# Patient Record
Sex: Male | Born: 1990 | Race: White | Hispanic: No | Marital: Single | Smoking: Never smoker
Health system: Southern US, Community
[De-identification: ages and names within clinical notes are randomized; demographics above are authoritative.]

## PROBLEM LIST (undated history)

## (undated) DIAGNOSIS — Z789 Other specified health status: Secondary | ICD-10-CM

## (undated) DIAGNOSIS — S83519A Sprain of anterior cruciate ligament of unspecified knee, initial encounter: Secondary | ICD-10-CM

## (undated) DIAGNOSIS — S83206A Unspecified tear of unspecified meniscus, current injury, right knee, initial encounter: Secondary | ICD-10-CM

---
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
## 2011-08-28 ENCOUNTER — Encounter (HOSPITAL_COMMUNITY): Payer: Self-pay

## 2011-08-28 ENCOUNTER — Emergency Department (HOSPITAL_COMMUNITY)
Admission: EM | Admit: 2011-08-28 | Discharge: 2011-08-28 | Disposition: A | Discharge status: Home / Self Care | Payer: Self-pay | Source: Home / Self Care | Attending: Emergency Medicine | Admitting: Emergency Medicine

## 2011-08-28 DIAGNOSIS — N453 Epididymo-orchitis: Secondary | ICD-10-CM

## 2011-08-28 DIAGNOSIS — N451 Epididymitis: Secondary | ICD-10-CM

## 2011-08-28 LAB — POCT URINALYSIS DIP (DEVICE)
Bilirubin Urine: NEGATIVE
Glucose, UA: NEGATIVE mg/dL
Hgb urine dipstick: NEGATIVE
Nitrite: NEGATIVE
Specific Gravity, Urine: 1.025 (ref 1.005–1.030)

## 2011-08-28 MED ORDER — DICLOFENAC SODIUM 75 MG PO TBEC: 75.0000 mg | DELAYED_RELEASE_TABLET | Freq: Two times a day (BID) | ORAL | Status: DC

## 2011-08-28 MED ORDER — LIDOCAINE HCL (PF) 1 % IJ SOLN
INTRAMUSCULAR | Status: AC
  Filled 2011-08-28: qty 5

## 2011-08-28 MED ORDER — CEFTRIAXONE SODIUM 250 MG IJ SOLR
250.0000 mg | Freq: Once | INTRAMUSCULAR | Status: AC
  Administered 2011-08-28: 250 mg via INTRAMUSCULAR

## 2011-08-28 MED ORDER — CEFTRIAXONE SODIUM 250 MG IJ SOLR
INTRAMUSCULAR | Status: AC
  Filled 2011-08-28: qty 250

## 2011-08-28 MED ORDER — TRAMADOL HCL 50 MG PO TABS: 100.0000 mg | ORAL_TABLET | Freq: Three times a day (TID) | ORAL | Status: AC | PRN

## 2011-08-28 MED ORDER — AZITHROMYCIN 250 MG PO TABS
1000.0000 mg | ORAL_TABLET | Freq: Once | ORAL | Status: AC
  Administered 2011-08-28: 1000 mg via ORAL

## 2011-08-28 MED ORDER — AZITHROMYCIN 250 MG PO TABS
ORAL_TABLET | ORAL | Status: AC
  Filled 2011-08-28: qty 4

## 2011-08-28 NOTE — ED Provider Notes (Signed)
Chief Complaint  Patient presents with  . Testicle Pain    History of Present Illness:   [name] is a 21 year old male who has had a one-day history of left testicular swelling and sensitivity. He denies any dysuria or urethral discharge. He had intercourse last night and has a small bruise on the right side of the shaft of the penis. This is nontender. He denies fever, chills, sweats, nausea, or vomiting. He's had no bowel pain. No lymphadenopathy, skin rash, or genital ulcerations.  Review of Systems:  Other than noted above, the patient denies any of the following symptoms: General:  No fevers, chills, sweats, aches, or fatigue. GI:  No abdominal pain, back pain, nausea, vomiting, diarrhea, or constipation. GU:  No dysuria, frequency, urgency, hematuria, urethral discharge, penile lesions, penile pain, testicular pain, swelling, or mass, inguinal lymphadenopathy or incontinence.  PMFSH:  Past medical history, family history, social history, meds, and allergies were reviewed.  Physical Exam:   Vital signs:  BP 122/71  Pulse 68  Temp(Src) 98.9 F (37.2 C) (Oral)  Resp 18  SpO2 100% Gen:  Alert, oriented, in no distress. Lungs:  Clear to auscultation, no wheezes, rales or rhonchi. Heart:  Regular rhythm, no gallop or murmer. Abdomen:  Flat and soft.  No tenderness to palpation, guarding, or rebound.  No hepato-splenomegaly or mass.  Bowel sounds were normally active.  No hernia. Genital exam:  He has a small bruise on the right side of the shaft of the penis which was nontender. The right testicle was normal. The left testicle showed a mass underneath the testicle which was mildly tender to palpation. There is no urethral discharge and no ulcerations on the penis. Back:  No CVA tenderness.  Skin:  Clear, warm and dry.  Course in Urgent Care Center:   He was given Rocephin 250 mg IM and azithromycin 1000 mg by mouth.   Medical Decision Making:  This appears to be epididymitis, although  it could be a hydrocele or a testicular tumor. I told the patient was very important that he followup with urologist next week and gave him the name of Dr. Margarita Grizzle. A GC Chlamydia DNA probe was obtained as well as a routine urine and a culture.  Assessment:  Diagnoses that have been ruled out:  None  Diagnoses that are still under consideration:  None  Final diagnoses:  Epididymitis     Plan:   1.  The following meds were prescribed:   New Prescriptions   DICLOFENAC (VOLTAREN) 75 MG EC TABLET    Take 1 tablet (75 mg total) by mouth 2 (two) times daily.   TRAMADOL (ULTRAM) 50 MG TABLET    Take 2 tablets (100 mg total) by mouth every 8 (eight) hours as needed for pain.   2.  The patient was instructed in symptomatic care and handouts were given. 3.  The patient was told to return if becoming worse in any way, if no better in 3 or 4 days, and given some red flag symptoms that would indicate earlier return.  Follow up:  The patient was told to follow up with Dr. Margarita Grizzle next week.      Reuben Likes, MD 08/28/11 2055

## 2011-08-28 NOTE — ED Notes (Signed)
States he had sex last pm, and this AM, noted to have purple area on penile shaft and testicular swelling w pain; denies STD concerns prior to sex last PM

## 2011-08-28 NOTE — Discharge Instructions (Signed)
Epididymitis Epididymitis is a swelling (inflammation) of the epididymis. The epididymis is a cord-like structure along the back part of the testicle. Epididymitis is usually, but not always, caused by infection. This is usually a sudden problem beginning with chills, fever and pain behind the scrotum and in the testicle. There may be swelling and redness of the testicle. DIAGNOSIS  Physical examination will reveal a tender, swollen epididymis. Sometimes, cultures are obtained from the urine or from prostate secretions to help find out if there is an infection or if the cause is a different problem. Sometimes, blood work is performed to see if your white blood cell count is elevated and if a germ (bacterial) or viral infection is present. Using this knowledge, an appropriate medicine which kills germs (antibiotic) can be chosen by your caregiver. A viral infection causing epididymitis will most often go away (resolve) without treatment. HOME CARE INSTRUCTIONS   Hot sitz baths for 20 minutes, 4 times per day, may help relieve pain.   Only take over-the-counter or prescription medicines for pain, discomfort or fever as directed by your caregiver.   Take all medicines, including antibiotics, as directed. Take the antibiotics for the full prescribed length of time even if you are feeling better.   It is very important to keep all follow-up appointments.  SEEK IMMEDIATE MEDICAL CARE IF:   You have a fever.   You have pain not relieved with medicines.   You have any worsening of your problems.   Your pain seems to come and go.   You develop pain, redness, and swelling in the scrotum and surrounding areas.  MAKE SURE YOU:   Understand these instructions.   Will watch your condition.   Will get help right away if you are not doing well or get worse.  Document Released: 05/15/2000 Document Revised: 05/07/2011 Document Reviewed: 04/04/2009 [facility] Patient Information 2012 [place],  [place].Testicular Masses Most testicular masses, such as a growth or a swelling, are benign. This means they are not cancerous. Common types of testicular masses include:   Hydrocele is the most common benign testicular mass in an adult. Hydroceles are generally soft, painless scrotal swellings that are collections of fluid in the scrotal sac. These can rapidly change size as the fluid enters or leaves.   Spermatoceles are generally soft, painless, benign swellings that are cyst-like masses in the scrotum containing fluid. They can rapidly change size as the fluid enters or leaves. They are more prominent while standing or exercising. Sometimes, spermatoceles may cause a sensation of heaviness or a dull ache.   Varicocele is an enlargement of the veins that drain the testicles. This condition can increase the risk of infertility. They are more prominent while standing or exercising. Sometimes, varicoceles may cause a sensation of heaviness or a dull ache.   Inguinal hernia is a bulge caused by a portion of intestine protruding into the scrotum through a weak area in the abdominal muscles. Hernias may or may not be painful. They are soft and usually enlarge with coughing or straining.   Torsion of the testis can cause a testicular mass that develops quickly and is associated with tenderness and/or fever. This is caused by a twisting of the testicle within the sac. It also reduces the blood supply and can destroy the testis if not treated quickly with surgery.   Epididymitis is inflammation of the epididymis (a structure attached to the testicle), usually caused by a sexually transmitted infection or a urinary tract infection. This generally  shows up as testicular discomfort and swelling, and may include pain during urination.   Testicular appendages are remnants of tissue on the testis present since birth. A testicular appendage can twist on its blood supply and cause pain. In most cases, this is seen as  a blue dot on the scrotum.  A cancerous growth in the scrotum may first appear as a swelling. There may or may not be pain. The growth usually feels firm and shows up as a growth on the testicle. Any solid, firm growth in a testicle is considered cancer until proven otherwise. Cancer of the testicle most commonly affects men 94 to 20 years old. Risk factors include prior testicular tumor and cryptorchidism (undescended testis). Occasionally, testicular cancer may appear with symptoms (problems) of metastasis. This means the tumor (abnormal growth) has spread and is causing other problems that may include cough, shortness of breath or weight loss. Monthly testicular self-exams are recommended for all men. Get in the habit of examining your own testicles. A good time is while taking a shower. Get to know what your testicles feel like so you will know if there is a new growth or change in them. DIAGNOSIS  See your caregiver if you feel a growth in your testicle. Sometimes, all that is needed to make the diagnosis (determine what is wrong) is a physical exam. Your caregiver may shine a bright light through the scrotum to help make the diagnosis. This is called transillumination. The light will shine easily through a collection of fluid but will usually not shine through a tumor. Other testing, including blood tests and an ultrasound exam, may be done. An ultrasound exam bounces harmless sound waves off the testicles and produces a black and white picture almost like that produced by a camera. Diagnosis of testicular cancer can be made by measuring several substances in the blood, called markers), that may indicate the presence of certain cancers. TREATMENT  What is wrong determines how it is treated. Small hydroceles and spermatoceles often require no treatment. In some cases, however, they may be treated surgically. Hernias are repaired with surgery. Because epididymitis is usually caused by an infection, it is  usually treated with antibiotics. Varicoceles may be treated by surgery to tie off the affected veins. Testicular cancer treatment depends upon the type of cancer. Sometimes, some tissue is removed surgically as a way of trying to preserve the testicle but if a tumor is suspected, the preferred treatment is removal of the entire testicle. Further treatment may include watching the growth with strict follow-up, chemotherapy or radiation. If a growth has been found in a testicle, your caregiver will help you determine the best treatment. Document Released: 11/22/2002 Document Revised: 05/07/2011 Document Reviewed: 05/18/2005 [facility] Patient Information 2012 [place], [place].

## 2011-08-30 LAB — URINE CULTURE: Colony Count: NO GROWTH

## 2011-08-31 ENCOUNTER — Telehealth (HOSPITAL_COMMUNITY): Payer: Self-pay | Admitting: *Deleted

## 2011-08-31 LAB — GC/CHLAMYDIA PROBE AMP, GENITAL
Chlamydia, DNA Probe: UNDETERMINED
GC Probe Amp, Genital: NEGATIVE

## 2011-08-31 NOTE — ED Notes (Signed)
GC neg., Chlamydia Indeterminate, Urine culture: No growth. Pt. adequately treated with Zithromax and Rocephin.  No DHHS form required for Indeterminate results.  I called pt. but recording said subscriber was not available. Not able to leave a message. Vassie Moselle 08/31/2011

## 2011-09-01 ENCOUNTER — Encounter (HOSPITAL_COMMUNITY): Payer: Self-pay | Admitting: Emergency Medicine

## 2011-09-01 ENCOUNTER — Telehealth (HOSPITAL_COMMUNITY): Payer: Self-pay | Admitting: *Deleted

## 2011-09-01 ENCOUNTER — Emergency Department (INDEPENDENT_AMBULATORY_CARE_PROVIDER_SITE_OTHER)
Admission: EM | Admit: 2011-09-01 | Discharge: 2011-09-01 | Disposition: A | Discharge status: Home / Self Care | Payer: Self-pay | Source: Home / Self Care | Attending: Family Medicine | Admitting: Family Medicine

## 2011-09-01 DIAGNOSIS — J302 Other seasonal allergic rhinitis: Secondary | ICD-10-CM

## 2011-09-01 DIAGNOSIS — J309 Allergic rhinitis, unspecified: Secondary | ICD-10-CM

## 2011-09-01 DIAGNOSIS — T50905A Adverse effect of unspecified drugs, medicaments and biological substances, initial encounter: Secondary | ICD-10-CM

## 2011-09-01 DIAGNOSIS — T887XXA Unspecified adverse effect of drug or medicament, initial encounter: Secondary | ICD-10-CM

## 2011-09-01 MED ORDER — FLUTICASONE PROPIONATE 50 MCG/ACT NA SUSP: 2.0000 | Freq: Every day | NASAL | Status: DC

## 2011-09-01 MED ORDER — HYDROCOD POLST-CHLORPHEN POLST 10-8 MG/5ML PO LQCR: 5.0000 mL | Freq: Two times a day (BID) | ORAL | Status: DC

## 2011-09-01 MED ORDER — CETIRIZINE HCL 10 MG PO TABS: 10.0000 mg | ORAL_TABLET | Freq: Every day | ORAL | Status: DC

## 2011-09-01 NOTE — Discharge Instructions (Signed)
Stop other medicines if not needed for pain, see dr Margarita Grizzle as planned.

## 2011-09-01 NOTE — ED Provider Notes (Signed)
History     CSN: [id number]  Arrival date & time 09/01/11  1532   First MD Initiated Contact with Patient 09/01/11 1605      Chief Complaint  Patient presents with  . Abdominal Pain    (Consider location/radiation/quality/duration/timing/severity/associated sxs/prior treatment) Patient is a 21 y.o. male presenting with abdominal pain. The history is provided by the patient.  Abdominal Pain The primary symptoms of the illness include abdominal pain, fever, nausea and vomiting. The primary symptoms of the illness do not include diarrhea. The current episode started more than 2 days ago (seen 3/29 for epididimytis, given meds, now with cough, congestion, rhinorrhea.). The onset of the illness was gradual.    History reviewed. No pertinent past medical history.  History reviewed. No pertinent past surgical history.  No family history on file.  History  Substance Use Topics  . Smoking status: Never Smoker   . Smokeless tobacco: Not on file  . Alcohol Use: No      Review of Systems  Constitutional: Positive for fever.  HENT: Positive for congestion, rhinorrhea, sneezing and postnasal drip.   Respiratory: Positive for cough.   Cardiovascular: Positive for chest pain.  Gastrointestinal: Positive for nausea, vomiting and abdominal pain. Negative for diarrhea.    Allergies  Review of patient's allergies indicates no known allergies.  Home Medications   Current Outpatient Rx  Name Route Sig Dispense Refill  . DICLOFENAC SODIUM 75 MG PO TBEC Oral Take 1 tablet (75 mg total) by mouth 2 (two) times daily. 20 tablet 0  . TRAMADOL HCL 50 MG PO TABS Oral Take 2 tablets (100 mg total) by mouth every 8 (eight) hours as needed for pain. 30 tablet 0  . CETIRIZINE HCL 10 MG PO TABS Oral Take 1 tablet (10 mg total) by mouth daily. One tab daily for allergies 30 tablet 1  . HYDROCOD POLST-CPM POLST ER 10-8 MG/5ML PO LQCR Oral Take 5 mLs by mouth every 12 (twelve) hours. 115 mL 0  .  FLUTICASONE PROPIONATE 50 MCG/ACT NA SUSP Nasal Place 2 sprays into the nose daily. 1 g 2    BP 114/64  Pulse 84  Temp(Src) 97.1 F (36.2 C) (Oral)  Resp 16  SpO2 99%  Physical Exam  Nursing note and vitals reviewed. Constitutional: He is oriented to person, place, and time. He appears well-developed and well-nourished.  HENT:  Head: Normocephalic.  Right Ear: External ear normal.  Left Ear: External ear normal.  Nose: Mucosal edema and rhinorrhea present.  Mouth/Throat: Oropharynx is clear and moist.  Eyes: Conjunctivae are normal. Pupils are equal, round, and reactive to light.  Neck: Normal range of motion. Neck supple.  Cardiovascular: Normal rate, regular rhythm, normal heart sounds and intact distal pulses.   Pulmonary/Chest: Effort normal and breath sounds normal.  Lymphadenopathy:    He has no cervical adenopathy.  Neurological: He is alert and oriented to person, place, and time.  Skin: Skin is warm and dry.    ED Course  Procedures (including critical care time)  Labs Reviewed - No data to display No results found.   1. Seasonal allergic rhinitis   2. Adverse effects of medication       MDM          Linna Hoff, MD 09/01/11 [phone]

## 2011-09-01 NOTE — ED Notes (Signed)
Pt. in room 1 for another complaint. Pt. verified and given his results from 3/29. Pt. told his result for Chlamydia was indeterminate but that means it was initially positive but could not be verified. Pt. told he was adequately treated.  Pt. instructed to notify his partner, no sex for 1 week and to practice safe sex. Pt. told they can get HIV testing at the [facility] STD clinic.  Pt. voiced understanding.  Felix Pacini RN notified I had talked to pt. about his results. Vassie Moselle 09/01/2011

## 2011-09-01 NOTE — ED Notes (Signed)
C/o abdominal pain and fever, onset Friday. Reports one episode of vomiting today.  Reports nausea today.  Denies diarrhea.  Denies family members being sick. Reports temp of 101.5 over the weekend.

## 2011-12-16 ENCOUNTER — Encounter (HOSPITAL_COMMUNITY): Payer: Self-pay | Admitting: *Deleted

## 2011-12-16 ENCOUNTER — Emergency Department (HOSPITAL_COMMUNITY)
Admission: EM | Admit: 2011-12-16 | Discharge: 2011-12-16 | Disposition: A | Discharge status: Home / Self Care | Payer: Self-pay | Attending: Emergency Medicine | Admitting: Emergency Medicine

## 2011-12-16 DIAGNOSIS — Z23 Encounter for immunization: Secondary | ICD-10-CM | POA: Insufficient documentation

## 2011-12-16 DIAGNOSIS — W219XXA Striking against or struck by unspecified sports equipment, initial encounter: Secondary | ICD-10-CM | POA: Insufficient documentation

## 2011-12-16 DIAGNOSIS — S0180XA Unspecified open wound of other part of head, initial encounter: Secondary | ICD-10-CM | POA: Insufficient documentation

## 2011-12-16 DIAGNOSIS — Y9361 Activity, american tackle football: Secondary | ICD-10-CM | POA: Insufficient documentation

## 2011-12-16 DIAGNOSIS — S0191XA Laceration without foreign body of unspecified part of head, initial encounter: Secondary | ICD-10-CM

## 2011-12-16 MED ORDER — TETANUS-DIPHTH-ACELL PERTUSSIS 5-2.5-18.5 LF-MCG/0.5 IM SUSP
0.5000 mL | Freq: Once | INTRAMUSCULAR | Status: AC
  Administered 2011-12-16: 0.5 mL via INTRAMUSCULAR
  Filled 2011-12-16: qty 0.5

## 2011-12-16 NOTE — ED Notes (Signed)
NP at bedside for suturing

## 2011-12-16 NOTE — ED Notes (Signed)
Apologized for delays.  Lac cleaned and dressed by this RN.

## 2011-12-16 NOTE — ED Notes (Signed)
NP continues at bedside for lac repair.

## 2011-12-16 NOTE — ED Notes (Addendum)
Patient was playing football when he was elbowed on his right forehead.  Denies any LOC.  Laceration is on his right eyebrow and an inch.  No bleeding at this time

## 2011-12-17 NOTE — ED Provider Notes (Signed)
History     CSN: [id number]  Arrival date & time 12/16/11  2009   First MD Initiated Contact with Patient 12/16/11 2213      Chief Complaint  Patient presents with  . Head Laceration    (Consider location/radiation/quality/duration/timing/severity/associated sxs/prior treatment) HPI Comments: [name] 21 y.o. male   The chief complaint is: Patient presents with:   Head Laceration   The patient has medical history significant for:   History reviewed. No pertinent past medical history.  Patient present with laceration s/p elbow to the forehead while playing football at 7:45pm on 12/16/11. Patient states there was a significant amount of bleeding after the blow, but he did not lose consciousness. Denies HA, vision changes. Denies NV. Denies SOB. Patient unsure of last tetanus.     Patient is a 21 y.o. male presenting with scalp laceration. The history is provided by the patient.  Head Laceration Pertinent negatives include no abdominal pain, headaches, nausea, neck pain or vomiting.    History reviewed. No pertinent past medical history.  History reviewed. No pertinent past surgical history.  History reviewed. No pertinent family history.  History  Substance Use Topics  . Smoking status: Never Smoker   . Smokeless tobacco: Not on file  . Alcohol Use: No      Review of Systems  Constitutional: Negative for activity change.  HENT: Negative for neck pain.   Eyes: Negative for visual disturbance.  Respiratory: Negative for shortness of breath.   Gastrointestinal: Negative for nausea, vomiting and abdominal pain.  Skin: Positive for wound.  Neurological: Negative for dizziness, syncope and headaches.    Allergies  Review of patient's allergies indicates no known allergies.  Home Medications  No current outpatient prescriptions on file.  BP 140/74  Pulse 71  Temp 97.9 F (36.6 C) (Oral)  Resp 20  SpO2 98%  Physical Exam  Nursing note and vitals  reviewed. Constitutional: He appears well-developed and well-nourished.  HENT:  Head: Normocephalic. Head is with laceration.         2cm laceration involving the right eyebrow.   Neck: Normal range of motion.  Cardiovascular: Normal rate, regular rhythm and normal heart sounds.   Pulmonary/Chest: Effort normal and breath sounds normal.  Abdominal: Soft. Bowel sounds are normal. There is no tenderness.  Neurological: He is alert.  Skin: Skin is warm and dry.    ED Course  Procedures (including critical care time)  Labs Reviewed - No data to display No results found.   1. Laceration of head   LACERATION REPAIR Performed by: Pixie Casino Authorized by: Pixie Casino Consent: Verbal consent obtained. Risks and benefits: risks, benefits and alternatives were discussed Consent given by: patient Patient identity confirmed: provided demographic data Prepped and Draped in normal sterile fashion Wound explored  Laceration Location: right eyebrow extending into forehead  Laceration Length: 2cm  No Foreign Bodies seen or palpated  Anesthesia: local infiltration  Local anesthetic: lidocaine 2%  Anesthetic total: 3ml  Irrigation method: syringe Amount of cleaning: standard  Skin closure: well approximated   Number of sutures: 6  Technique: simple interrupted  Patient tolerance: Patient tolerated the procedure well with no immediate complications.     MDM  21 y/o male who presents with laceration s/p elbow to the forehead. 6 sutures 3.0 vicryl place successfully without complication. Patient discharged with instructions to return to ED, urgent care, or PCP in 5 days for removal. Tetanus given in ED. Patient given return precautions verbally and in  discharge instructions.        Pixie Casino, PA-C 12/17/11 0128

## 2011-12-18 NOTE — ED Provider Notes (Signed)
Medical screening examination/treatment/procedure(s) were performed by non-physician practitioner and as supervising physician I was immediately available for consultation/collaboration.   Raidyn Breiner, MD 12/18/11 1534 

## 2011-12-21 ENCOUNTER — Encounter (HOSPITAL_COMMUNITY): Payer: Self-pay | Admitting: Emergency Medicine

## 2011-12-21 ENCOUNTER — Emergency Department (HOSPITAL_COMMUNITY)
Admission: EM | Admit: 2011-12-21 | Discharge: 2011-12-21 | Disposition: A | Discharge status: Home / Self Care | Payer: Self-pay | Attending: Emergency Medicine | Admitting: Emergency Medicine

## 2011-12-21 DIAGNOSIS — Z4802 Encounter for removal of sutures: Secondary | ICD-10-CM | POA: Insufficient documentation

## 2011-12-21 NOTE — ED Notes (Signed)
Sutures  Need removing edges well approximated no s/s of infection

## 2011-12-21 NOTE — ED Provider Notes (Signed)
History   This chart was scribed for No att. providers found by Toya Smothers. The patient was seen in room TR08C/TR08C. Patient's care was started at 1313.  CSN: [id number]  Arrival date & time 12/21/11  1313   None     Chief Complaint  Patient presents with  . Suture / Staple Removal   Patient is a 21 y.o. male presenting with suture removal. The history is provided by the patient. No language interpreter was used.  Suture / Staple Removal    [name] is a 21 y.o. male who presents to the Emergency Department for staple removal. Pt sustained an elbow to the face while playing football causing the laceration. Sutures were placed a week ago, and everything appears to be healing fine. Pt has no complaints.  History reviewed. No pertinent past medical history.  History reviewed. No pertinent past surgical history.  History reviewed. No pertinent family history.  History  Substance Use Topics  . Smoking status: Never Smoker   . Smokeless tobacco: Not on file  . Alcohol Use: No   Review of Systems  Constitutional: Negative for fever and chills.  HENT: Negative for rhinorrhea and neck pain.   Eyes: Negative for pain.  Respiratory: Negative for cough and shortness of breath.   Cardiovascular: Negative for chest pain.  Gastrointestinal: Negative for nausea, vomiting, abdominal pain and diarrhea.  Genitourinary: Negative for dysuria.  Musculoskeletal: Negative for back pain.  Skin: Positive for wound. Negative for rash.  Neurological: Negative for dizziness and weakness.    Allergies  Review of patient's allergies indicates no known allergies.  Home Medications  No current outpatient prescriptions on file.  BP 132/76  Pulse 91  Temp 98.2 F (36.8 C) (Oral)  Resp 16  SpO2 98%  Physical Exam  Nursing note and vitals reviewed. Constitutional: He is oriented to person, place, and time. He appears well-developed and well-nourished. No distress.  HENT:  Head:  Normocephalic and atraumatic.       Laceration above the R eyebrow. Sutures have been removed and appear to be healing fine.  Neck: Normal range of motion. Neck supple. No tracheal deviation present.  Pulmonary/Chest: Effort normal. No respiratory distress.  Abdominal: He exhibits no distension.  Musculoskeletal: Normal range of motion. He exhibits no edema.  Neurological: He is alert and oriented to person, place, and time. No sensory deficit.  Skin: Skin is warm and dry.  Psychiatric: He has a normal mood and affect. His behavior is normal.    ED Course  Procedures (including critical care time) DIAGNOSTIC STUDIES: Oxygen Saturation is 98% on room air, normal by my interpretation.    COORDINATION OF CARE: 1511- Evaluated Pt. Advised pt to wait a week prior to fighting agf  Labs Reviewed - No data to display No results found.   No diagnosis found.    MDM  Sutures removed and wound looks good.  Will discharge to home.      I personally performed the services described in this documentation, which was scribed in my presence. The recorded information has been reviewed and considered.      Geoffery Lyons, MD 12/21/11 [phone]

## 2012-10-04 HISTORY — PX: KNEE ARTHROSCOPY WITH ANTERIOR CRUCIATE LIGAMENT (ACL) REPAIR: SHX5644

## 2013-05-15 ENCOUNTER — Encounter (HOSPITAL_BASED_OUTPATIENT_CLINIC_OR_DEPARTMENT_OTHER): Payer: Self-pay | Admitting: *Deleted

## 2013-05-15 ENCOUNTER — Other Ambulatory Visit: Payer: Self-pay | Admitting: Orthopedic Surgery

## 2013-05-15 NOTE — Progress Notes (Signed)
NPO AFTER MN WITH EXCEPTION WATER/ GATORADE UNTIL 0700. ARRIVE AT 1100. NEEDS HG.

## 2013-05-16 ENCOUNTER — Encounter (HOSPITAL_BASED_OUTPATIENT_CLINIC_OR_DEPARTMENT_OTHER): Payer: Self-pay

## 2013-05-16 ENCOUNTER — Encounter (HOSPITAL_BASED_OUTPATIENT_CLINIC_OR_DEPARTMENT_OTHER): Payer: BC Managed Care – PPO | Admitting: Anesthesiology

## 2013-05-16 ENCOUNTER — Ambulatory Visit (HOSPITAL_BASED_OUTPATIENT_CLINIC_OR_DEPARTMENT_OTHER)
Admission: RE | Admit: 2013-05-16 | Discharge: 2013-05-16 | Disposition: A | Discharge status: Home / Self Care | Payer: BC Managed Care – PPO | Source: Ambulatory Visit | Attending: Specialist | Admitting: Specialist

## 2013-05-16 ENCOUNTER — Ambulatory Visit (HOSPITAL_BASED_OUTPATIENT_CLINIC_OR_DEPARTMENT_OTHER): Payer: BC Managed Care – PPO | Admitting: Anesthesiology

## 2013-05-16 ENCOUNTER — Encounter (HOSPITAL_BASED_OUTPATIENT_CLINIC_OR_DEPARTMENT_OTHER)
Admission: RE | Disposition: A | Discharge status: Home / Self Care | Payer: Self-pay | Source: Ambulatory Visit | Attending: Specialist

## 2013-05-16 DIAGNOSIS — M238X9 Other internal derangements of unspecified knee: Secondary | ICD-10-CM | POA: Insufficient documentation

## 2013-05-16 DIAGNOSIS — IMO0002 Reserved for concepts with insufficient information to code with codable children: Secondary | ICD-10-CM | POA: Insufficient documentation

## 2013-05-16 DIAGNOSIS — X58XXXA Exposure to other specified factors, initial encounter: Secondary | ICD-10-CM | POA: Insufficient documentation

## 2013-05-16 DIAGNOSIS — Z9889 Other specified postprocedural states: Secondary | ICD-10-CM

## 2013-05-16 DIAGNOSIS — S83509A Sprain of unspecified cruciate ligament of unspecified knee, initial encounter: Secondary | ICD-10-CM | POA: Insufficient documentation

## 2013-05-16 HISTORY — DX: Unspecified tear of unspecified meniscus, current injury, right knee, initial encounter: S83.206A

## 2013-05-16 HISTORY — DX: Sprain of anterior cruciate ligament of unspecified knee, initial encounter: S83.519A

## 2013-05-16 HISTORY — PX: KNEE ARTHROSCOPY WITH ANTERIOR CRUCIATE LIGAMENT (ACL) REPAIR: SHX5644

## 2013-05-16 SURGERY — KNEE ARTHROSCOPY WITH ANTERIOR CRUCIATE LIGAMENT (ACL) REPAIR
Anesthesia: General | Site: Knee | Laterality: Right

## 2013-05-16 MED ORDER — LACTATED RINGERS IR SOLN
Status: DC | PRN
  Administered 2013-05-16: 27000 mL

## 2013-05-16 MED ORDER — FENTANYL CITRATE 0.05 MG/ML IJ SOLN
INTRAMUSCULAR | Status: AC
  Filled 2013-05-16: qty 4

## 2013-05-16 MED ORDER — LIDOCAINE HCL (CARDIAC) 20 MG/ML IV SOLN
INTRAVENOUS | Status: DC | PRN
  Administered 2013-05-16: 100 mg via INTRAVENOUS

## 2013-05-16 MED ORDER — SODIUM CHLORIDE 0.9 % IR SOLN
Status: DC | PRN
  Administered 2013-05-16: 500 mL

## 2013-05-16 MED ORDER — METHOCARBAMOL 500 MG PO TABS: 500.0000 mg | ORAL_TABLET | Freq: Four times a day (QID) | ORAL | Status: DC | PRN

## 2013-05-16 MED ORDER — MIDAZOLAM HCL 2 MG/2ML IJ SOLN
INTRAMUSCULAR | Status: AC
  Filled 2013-05-16: qty 2

## 2013-05-16 MED ORDER — LIDOCAINE-EPINEPHRINE 2 %-1:100000 IJ SOLN
INTRAMUSCULAR | Status: DC | PRN
  Administered 2013-05-16: 15 mL via PERINEURAL

## 2013-05-16 MED ORDER — CEPHALEXIN 500 MG PO CAPS: 500.0000 mg | ORAL_CAPSULE | Freq: Three times a day (TID) | ORAL | Status: DC

## 2013-05-16 MED ORDER — FENTANYL CITRATE 0.05 MG/ML IJ SOLN
100.0000 ug | Freq: Once | INTRAMUSCULAR | Status: AC
  Administered 2013-05-16: 100 ug via INTRAVENOUS
  Filled 2013-05-16: qty 2

## 2013-05-16 MED ORDER — MORPHINE SULFATE 4 MG/ML IJ SOLN
INTRAMUSCULAR | Status: AC
  Filled 2013-05-16: qty 1

## 2013-05-16 MED ORDER — FENTANYL CITRATE 0.05 MG/ML IJ SOLN
INTRAMUSCULAR | Status: DC | PRN
  Administered 2013-05-16 (×4): 25 ug via INTRAVENOUS

## 2013-05-16 MED ORDER — HYDROMORPHONE HCL PF 1 MG/ML IJ SOLN
0.2500 mg | INTRAMUSCULAR | Status: DC | PRN
  Filled 2013-05-16: qty 1

## 2013-05-16 MED ORDER — ONDANSETRON HCL 4 MG/2ML IJ SOLN
INTRAMUSCULAR | Status: DC | PRN
  Administered 2013-05-16: 4 mg via INTRAVENOUS

## 2013-05-16 MED ORDER — DEXAMETHASONE SODIUM PHOSPHATE 4 MG/ML IJ SOLN
INTRAMUSCULAR | Status: DC | PRN
  Administered 2013-05-16: 10 mg via INTRAVENOUS

## 2013-05-16 MED ORDER — PROPOFOL 10 MG/ML IV BOLUS
INTRAVENOUS | Status: DC | PRN
  Administered 2013-05-16: 200 mg via INTRAVENOUS

## 2013-05-16 MED ORDER — MORPHINE SULFATE 4 MG/ML IJ SOLN
INTRAMUSCULAR | Status: DC | PRN
  Administered 2013-05-16: 4 mg via INTRAVENOUS

## 2013-05-16 MED ORDER — ASPIRIN EC 325 MG PO TBEC: 325.0000 mg | DELAYED_RELEASE_TABLET | Freq: Two times a day (BID) | ORAL | Status: DC

## 2013-05-16 MED ORDER — LACTATED RINGERS IV SOLN
INTRAVENOUS | Status: DC
  Administered 2013-05-16 (×3): via INTRAVENOUS
  Filled 2013-05-16: qty 1000

## 2013-05-16 MED ORDER — KETOROLAC TROMETHAMINE 30 MG/ML IJ SOLN
15.0000 mg | Freq: Once | INTRAMUSCULAR | Status: DC | PRN
  Filled 2013-05-16: qty 1

## 2013-05-16 MED ORDER — SODIUM CHLORIDE 0.9 % IV SOLN
INTRAVENOUS | Status: DC
  Filled 2013-05-16: qty 1000

## 2013-05-16 MED ORDER — POVIDONE-IODINE 7.5 % EX SOLN
Freq: Once | CUTANEOUS | Status: DC
  Filled 2013-05-16: qty 118

## 2013-05-16 MED ORDER — LIDOCAINE HCL 1 % IJ SOLN
INTRAMUSCULAR | Status: AC
  Filled 2013-05-16: qty 20

## 2013-05-16 MED ORDER — METOCLOPRAMIDE HCL 5 MG/ML IJ SOLN
INTRAMUSCULAR | Status: DC | PRN
  Administered 2013-05-16: 10 mg via INTRAVENOUS

## 2013-05-16 MED ORDER — MIDAZOLAM HCL 5 MG/5ML IJ SOLN
INTRAMUSCULAR | Status: DC | PRN
  Administered 2013-05-16: 1 mg via INTRAVENOUS

## 2013-05-16 MED ORDER — CEFAZOLIN SODIUM-DEXTROSE 2-3 GM-% IV SOLR
2.0000 g | INTRAVENOUS | Status: AC
  Administered 2013-05-16: 2 g via INTRAVENOUS
  Filled 2013-05-16: qty 50

## 2013-05-16 MED ORDER — FENTANYL CITRATE 0.05 MG/ML IJ SOLN
INTRAMUSCULAR | Status: AC
  Filled 2013-05-16: qty 2

## 2013-05-16 MED ORDER — PROMETHAZINE HCL 25 MG/ML IJ SOLN
6.2500 mg | INTRAMUSCULAR | Status: DC | PRN
  Filled 2013-05-16: qty 1

## 2013-05-16 MED ORDER — ACETAMINOPHEN 10 MG/ML IV SOLN
INTRAVENOUS | Status: DC | PRN
  Administered 2013-05-16: 1000 mg via INTRAVENOUS

## 2013-05-16 MED ORDER — MIDAZOLAM HCL 2 MG/2ML IJ SOLN
2.0000 mg | Freq: Once | INTRAMUSCULAR | Status: AC
  Administered 2013-05-16: 2 mg via INTRAVENOUS
  Filled 2013-05-16: qty 2

## 2013-05-16 MED ORDER — OXYCODONE-ACETAMINOPHEN 5-325 MG PO TABS: 1.0000 | ORAL_TABLET | ORAL | Status: DC | PRN

## 2013-05-16 MED ORDER — BUPIVACAINE HCL (PF) 0.25 % IJ SOLN
INTRAMUSCULAR | Status: DC | PRN
  Administered 2013-05-16: 30 mL

## 2013-05-16 MED ORDER — ROPIVACAINE HCL 5 MG/ML IJ SOLN
INTRAMUSCULAR | Status: DC | PRN
  Administered 2013-05-16: 25 mL via PERINEURAL

## 2013-05-16 SURGICAL SUPPLY — 86 items
ANCHOR BUTTON TIGHTROPE BTB (Anchor) ×2 IMPLANT
ANCHOR PUSHLOCK BIOCOMP 3.5X19 (Orthopedic Implant) ×2 IMPLANT
BANDAGE ESMARK 6X9 LF (GAUZE/BANDAGES/DRESSINGS) ×1 IMPLANT
BANDAGE GAUZE ELAST BULKY 4 IN (GAUZE/BANDAGES/DRESSINGS) ×2 IMPLANT
BENZOIN TINCTURE PRP APPL 2/3 (GAUZE/BANDAGES/DRESSINGS) ×2 IMPLANT
BLADE 4.2CUDA (BLADE) IMPLANT
BLADE CUDA 5.5 (BLADE) ×2 IMPLANT
BLADE CUDA GRT WHITE 3.5 (BLADE) ×2 IMPLANT
BLADE SURG 10 STRL SS (BLADE) ×4 IMPLANT
BLADE SURG 15 STRL LF DISP TIS (BLADE) ×1 IMPLANT
BLADE SURG 15 STRL SS (BLADE) ×1
BNDG ESMARK 6X9 LF (GAUZE/BANDAGES/DRESSINGS) ×2
BNDG GAUZE ELAST 4 BULKY (GAUZE/BANDAGES/DRESSINGS) ×4 IMPLANT
BONE MATRIX DEMINERALIZED 1CC (Bone Implant) ×6 IMPLANT
BUR OVAL 6.0 (BURR) IMPLANT
BUR VERTEX HOODED 4.5 (BURR) IMPLANT
BUTTON EXT TIGHTROPE 5X20 (Orthopedic Implant) ×2 IMPLANT
CANISTER SUCT LVC 12 LTR MEDI- (MISCELLANEOUS) ×4 IMPLANT
CANISTER SUCTION 1200CC (MISCELLANEOUS) IMPLANT
CANISTER SUCTION 2500CC (MISCELLANEOUS) IMPLANT
CLOTH BEACON ORANGE TIMEOUT ST (SAFETY) ×2 IMPLANT
COVER TABLE BACK 60X90 (DRAPES) ×2 IMPLANT
CUTTER FLIP II 9.5MM (INSTRUMENTS) ×2 IMPLANT
CUTTER RETRO 9.5MM (CUTTER) ×2 IMPLANT
DRAPE ARTHROSCOPY W/POUCH 114 (DRAPES) ×2 IMPLANT
DRAPE INCISE IOBAN 66X45 STRL (DRAPES) ×2 IMPLANT
DRAPE LG THREE QUARTER DISP (DRAPES) ×4 IMPLANT
DRAPE OEC MINIVIEW 54X84 (DRAPES) ×2 IMPLANT
DRAPE U-SHAPE 47X51 STRL (DRAPES) ×2 IMPLANT
DRSG PAD ABDOMINAL 8X10 ST (GAUZE/BANDAGES/DRESSINGS) ×2 IMPLANT
DURAPREP 26ML APPLICATOR (WOUND CARE) ×2 IMPLANT
ELECT REM PT RETURN 9FT ADLT (ELECTROSURGICAL) ×2
ELECTRODE REM PT RTRN 9FT ADLT (ELECTROSURGICAL) ×1 IMPLANT
GAUZE XEROFORM 1X8 LF (GAUZE/BANDAGES/DRESSINGS) ×2 IMPLANT
GLOVE BIO SURGEON STRL SZ7.5 (GLOVE) ×2 IMPLANT
GLOVE INDICATOR 7.5 STRL GRN (GLOVE) ×4 IMPLANT
GLOVE INDICATOR 8.0 STRL GRN (GLOVE) ×4 IMPLANT
GLOVE SURG ORTHO 8.0 STRL STRW (GLOVE) ×2 IMPLANT
GOWN PREVENTION PLUS LG XLONG (DISPOSABLE) ×4 IMPLANT
GOWN STRL REIN XL XLG (GOWN DISPOSABLE) ×4 IMPLANT
GUIDE PIN REFRO (PIN) ×2 IMPLANT
IMMOBILIZER KNEE 22 UNIV (SOFTGOODS) ×2 IMPLANT
IV LACTATED RINGER IRRG 3000ML (IV SOLUTION) ×9
IV LR IRRIG 3000ML ARTHROMATIC (IV SOLUTION) ×9 IMPLANT
KIT BIOCARTILAGE DEL W/SYRINGE (KITS) ×2 IMPLANT
KIT IMPLANT TIGHTROPE ABS OPEN (Anchor) ×2 IMPLANT
KIT TRANSTIBIAL (DISPOSABLE) IMPLANT
KNEE WRAP E Z 3 GEL PACK (MISCELLANEOUS) ×2 IMPLANT
LOOP 2 FIBERLINK CLOSED (SUTURE) ×2 IMPLANT
MINI VAC (SURGICAL WAND) IMPLANT
MIXING AND DELIVERY SYSTEM ×2 IMPLANT
NEEDLE HYPO 22GX1.5 SAFETY (NEEDLE) ×2 IMPLANT
NS IRRIG 500ML POUR BTL (IV SOLUTION) ×2 IMPLANT
PACK ARTHROSCOPY DSU (CUSTOM PROCEDURE TRAY) ×2 IMPLANT
PACK BASIN DAY SURGERY FS (CUSTOM PROCEDURE TRAY) ×2 IMPLANT
PAD ABD 8X10 STRL (GAUZE/BANDAGES/DRESSINGS) ×4 IMPLANT
PADDING CAST ABS 4INX4YD NS (CAST SUPPLIES)
PADDING CAST ABS COTTON 4X4 ST (CAST SUPPLIES) IMPLANT
PENCIL BUTTON HOLSTER BLD 10FT (ELECTRODE) ×2 IMPLANT
RETRO CUTTER 9.5MM ×2 IMPLANT
SET ARTHROSCOPY TUBING (MISCELLANEOUS) ×1
SET ARTHROSCOPY TUBING LN (MISCELLANEOUS) ×1 IMPLANT
SET PAD KNEE POSITIONER (MISCELLANEOUS) ×2 IMPLANT
SPONGE GAUZE 4X4 12PLY (GAUZE/BANDAGES/DRESSINGS) ×2 IMPLANT
SPONGE LAP 4X18 X RAY DECT (DISPOSABLE) IMPLANT
STRIP CLOSURE SKIN 1/2X4 (GAUZE/BANDAGES/DRESSINGS) ×2 IMPLANT
SUCTION FRAZIER TIP 10 FR DISP (SUCTIONS) ×2 IMPLANT
SUT 2 FIBERLOOP 20 STRT BLUE (SUTURE) ×4
SUT ETHILON 4 0 PS 2 18 (SUTURE) ×2 IMPLANT
SUT FIBERWIRE #2 38 T-5 BLUE (SUTURE) ×2
SUT MNCRL AB 3-0 PS2 18 (SUTURE) ×2 IMPLANT
SUT VIC AB 0 CT1 36 (SUTURE) ×4 IMPLANT
SUT VIC AB 0 CT2 27 (SUTURE) IMPLANT
SUT VIC AB 2-0 CT1 27 (SUTURE) ×1
SUT VIC AB 2-0 CT1 TAPERPNT 27 (SUTURE) ×1 IMPLANT
SUTURE 2 FIBERLOOP 20 STRT BLU (SUTURE) ×2 IMPLANT
SUTURE FIBERWR #2 38 T-5 BLUE (SUTURE) ×1 IMPLANT
SUTURE TIGERSTICK 2 TIGERWIR 2 (MISCELLANEOUS) ×2 IMPLANT
SYR CONTROL 10ML LL (SYRINGE) ×2 IMPLANT
TIGERSTICK 2 TIGERWIRE 2 (MISCELLANEOUS) ×4
TISSUE GRAFTLINK FGL (Tissue) ×2 IMPLANT
TOWEL OR 17X24 6PK STRL BLUE (TOWEL DISPOSABLE) ×2 IMPLANT
TightROPE abs BUTTON, ROUND ×2 IMPLANT
WAND 30 DEG SABER W/CORD (SURGICAL WAND) IMPLANT
WAND 90 DEG TURBOVAC W/CORD (SURGICAL WAND) IMPLANT
WATER STERILE IRR 500ML POUR (IV SOLUTION) ×2 IMPLANT

## 2013-05-16 NOTE — H&P (View-Only) (Signed)
NPO AFTER MN WITH EXCEPTION WATER/ GATORADE UNTIL 0700. ARRIVE AT 1100. NEEDS HG. 

## 2013-05-16 NOTE — Transfer of Care (Signed)
Immediate Anesthesia Transfer of Care Note  Patient: [name]  Procedure(s) Performed: Procedure(s) (LRB): RIGHT KNEE ARTHROSCOPY DEBRIDEMENT PARTIAL MEDIAL  MENISCECTOMY AND ANTERIOR CRUCIATE LIGAMENT (ACL) RECONSTRUCTION ALLOGRAFT REVISION  (Right)  Patient Location: PACU  Anesthesia Type: General  Level of Consciousness: awake, alert  and oriented  Airway & Oxygen Therapy: Patient Spontanous Breathing and Patient connected to face mask oxygen  Post-op Assessment: Report given to PACU RN and Post -op Vital signs reviewed and stable  Post vital signs: Reviewed and stable  Complications: No apparent anesthesia complications

## 2013-05-16 NOTE — Anesthesia Procedure Notes (Addendum)
Anesthesia Regional Block:  Femoral nerve block  Pre-Anesthetic Checklist: ,, timeout performed, Correct Patient, Correct Site, Correct Laterality, Correct Procedure, Correct Position, site marked, Risks and benefits discussed,  Surgical consent,  Pre-op evaluation,  At surgeon's request and post-op pain management   Prep: chloraprep       Needles:  Injection technique: Single-shot  Needle Type: Echogenic Stimulator Needle      Needle Gauge: 21 and 21 G    Additional Needles:  Procedures: ultrasound guided (picture in chart) Femoral nerve block Narrative:  Start time: 05/16/2013 1:08 PM End time: 05/16/2013 1:18 PM Injection made incrementally with aspirations every 5 mL.  Performed by: Personally  Anesthesiologist: Eilene Ghazi MD  Additional Notes: Patient tolerated the procedure well without complications   Procedure Name: LMA Insertion Date/Time: 05/16/2013 1:46 PM Performed by: Norva Pavlov Pre-anesthesia Checklist: Patient identified, Emergency Drugs available, Suction available and Patient being monitored Patient Re-evaluated:Patient Re-evaluated prior to inductionOxygen Delivery Method: Circle System Utilized Preoxygenation: Pre-oxygenation with 100% oxygen Intubation Type: IV induction Ventilation: Mask ventilation without difficulty LMA: LMA inserted LMA Size: 4.0 Number of attempts: 1 Airway Equipment and Method: bite block Placement Confirmation: positive ETCO2 Tube secured with: Tape Dental Injury: Teeth and Oropharynx as per pre-operative assessment

## 2013-05-16 NOTE — Interval H&P Note (Signed)
History and Physical Interval Note:  05/16/2013 1:33 PM  [name]  has presented today for surgery, with the diagnosis of RIGHT KNEE ACL TEAR AND MENISCAL TEAR   The various methods of treatment have been discussed with the patient and family. After consideration of risks, benefits and other options for treatment, the patient has consented to  Procedure(s): RIGHT KNEE ARTHROSCOPY DEBRIDEMENT PARTIAL MEDIAL  MENISCECTOMY AND ANTERIOR CRUCIATE LIGAMENT (ACL) RECONSTRUCTION ALLOGRAFT REVISION  (Right) as a surgical intervention .  The patient's history has been reviewed, patient examined, no change in status, stable for surgery.  I have reviewed the patient's chart and labs.  Questions were answered to the patient's satisfaction.     Cheyne Bungert ANDREW

## 2013-05-16 NOTE — H&P (Signed)
[name] is an 22 y.o. male.   Chief Complaint: Right knee pain HPI: Patient presents today for ACL revision and menisectomy of the right knee. Patient reports some discomfort and mild instability of the knee. Imaging confirmed a tear of the ACL graft previously reconstructed. Risk and benefits were discussed in detail with the patient. He wishes to proceed as consented. Denies SOB, CP , or calf pain.  Past Medical History  Diagnosis Date  . ACL (anterior cruciate ligament) tear     RIGHT KNEE  . Right knee meniscal tear     Past Surgical History  Procedure Laterality Date  . Knee arthroscopy with anterior cruciate ligament (acl) repair Right 10-04-2012    History reviewed. No pertinent family history. Social History:  reports that he has never smoked. He has never used smokeless tobacco. He reports that he does not drink alcohol or use illicit drugs.  Allergies: No Known Allergies  No prescriptions prior to admission    Results for orders placed during the hospital encounter of 05/16/13 (from the past 48 hour(s))  POCT HEMOGLOBIN-HEMACUE     Status: None   Collection Time    05/16/13 12:07 PM      Result Value Range   Hemoglobin 15.7  13.0 - 17.0 g/dL   No results found.  Review of Systems  Constitutional: Negative.   HENT: Negative.   Eyes: Negative.   Respiratory: Negative.   Cardiovascular: Negative.   Gastrointestinal: Negative.   Genitourinary: Negative.   Musculoskeletal: Negative.   Skin: Negative.   Neurological: Negative.   Endo/Heme/Allergies: Negative.   Psychiatric/Behavioral: Negative.     Blood pressure 118/73, pulse 64, temperature 97.3 F (36.3 C), temperature source Oral, resp. rate 14, height 5\' 11"  (1.803 m), weight 62.37 kg (137 lb 8 oz), SpO2 100.00%. Physical Exam  Constitutional: He is oriented to person, place, and time. He appears well-developed and well-nourished.  HENT:  Head: Normocephalic.  Eyes: EOM are normal.  Neck: Normal  range of motion. Neck supple.  Cardiovascular: Normal rate, regular rhythm and normal heart sounds.   Respiratory: Effort normal and breath sounds normal.  GI: Soft. Bowel sounds are normal.  Genitourinary:  Deferred  Musculoskeletal: He exhibits edema and tenderness.  Right knee  Neurological: He is alert and oriented to person, place, and time.  Skin: Skin is warm and dry.  Psychiatric: His behavior is normal.     Assessment/Plan ACL and meniscus tears: ACL revision and menisectomy today at [facility] surgery center D/C Home today with family Follow up in 3 days to office Follow d/c instructions  Brigida Scotti L 05/16/2013, 1:11 PM

## 2013-05-16 NOTE — Op Note (Signed)
Dictated # [id number]

## 2013-05-16 NOTE — Anesthesia Preprocedure Evaluation (Signed)
Anesthesia Evaluation  Patient identified by MRN, date of birth, ID band Patient awake    Reviewed: Allergy & Precautions, H&P , NPO status , Patient's Chart, lab work & pertinent test results  Airway Mallampati: II TM Distance: >3 FB Neck ROM: Full    Dental no notable dental hx.    Pulmonary neg pulmonary ROS,  breath sounds clear to auscultation  Pulmonary exam normal       Cardiovascular negative cardio ROS  Rhythm:Regular Rate:Normal     Neuro/Psych negative neurological ROS  negative psych ROS   GI/Hepatic negative GI ROS, Neg liver ROS,   Endo/Other  negative endocrine ROS  Renal/GU negative Renal ROS  negative genitourinary   Musculoskeletal negative musculoskeletal ROS (+)   Abdominal   Peds negative pediatric ROS (+)  Hematology negative hematology ROS (+)   Anesthesia Other Findings   Reproductive/Obstetrics negative OB ROS                           Anesthesia Physical Anesthesia Plan  ASA: I  Anesthesia Plan: General   Post-op Pain Management:    Induction: Intravenous  Airway Management Planned: LMA  Additional Equipment:   Intra-op Plan:   Post-operative Plan: Extubation in OR  Informed Consent: I have reviewed the patients History and Physical, chart, labs and discussed the procedure including the risks, benefits and alternatives for the proposed anesthesia with the patient or authorized representative who has indicated his/her understanding and acceptance.   Dental advisory given  Plan Discussed with: CRNA and Surgeon  Anesthesia Plan Comments:         Anesthesia Quick Evaluation  

## 2013-05-17 NOTE — Anesthesia Postprocedure Evaluation (Signed)
  Anesthesia Post-op Note  Patient: [name]  Procedure(s) Performed: Procedure(s) (LRB): RIGHT KNEE ARTHROSCOPY DEBRIDEMENT PARTIAL MEDIAL  MENISCECTOMY AND ANTERIOR CRUCIATE LIGAMENT (ACL) RECONSTRUCTION ALLOGRAFT REVISION  (Right)  Patient Location: PACU  Anesthesia Type: GA combined with regional for post-op pain  Level of Consciousness: awake and alert   Airway and Oxygen Therapy: Patient Spontanous Breathing  Post-op Pain: mild  Post-op Assessment: Post-op Vital signs reviewed, Patient's Cardiovascular Status Stable, Respiratory Function Stable, Patent Airway and No signs of Nausea or vomiting  Last Vitals:  Filed Vitals:   05/16/13 1800  BP: 110/64  Pulse: 89  Temp: 36.6 C  Resp: 17    Post-op Vital Signs: stable   Complications: No apparent anesthesia complications

## 2013-05-19 NOTE — Op Note (Signed)
[name], [name]                 ACCOUNT NO.:  [id number]  MEDICAL RECORD NO.:  [id number]  LOCATION:                               FACILITY:  [id number]  PHYSICIAN:  Erasmo Leventhal, M.D.DATE OF BIRTH:  Dec 17, 1990  DATE OF PROCEDURE:  05/16/2013 DATE OF DISCHARGE:  05/16/2013                              OPERATIVE REPORT   PREOPERATIVE DIAGNOSES:  Right knee rupture of anterior cruciate ligament graft, possible torn medial meniscus.  POSTOPERATIVE DIAGNOSES: 1. Complete rupture of anterior cruciate ligament graft. 2. Partial tear, posterior horn of medial meniscus undersurface,     stable.  PROCEDURE:  Right knee arthroscopic-assisted allograft, anterior cruciate ligament revision reconstruction.  SURGEON:  Erasmo Leventhal, M.D.  ASSISTANT:  Arsenio Loader, PA-C  ANESTHESIA:  Femoral nerve block with general.  ESTIMATED BLOOD LOSS:  Less than 10 mL.  DRAINS:  None.  COMPLICATIONS:  None.  TOURNIQUET TIME:  85 minutes at 250 mmHg.  DISPOSITION:  PACU, stable.  OPERATIVE DETAILS:  The patient was counseled in the holding area. Correct site was identified.  Correct site was identified.  Femoral nerve block was administered.  On the way to the operating room, TED hose applied on the involved leg.  On the way to the operating room, IV Ancef was given.  In the OR, the patient was placed in supine position under general anesthesia.  All extremities were well padded and bumped. Right knee was then elevated, prepped with DuraPrep and draped in sterile fashion.  Esmarch tourniquet was inflated to 250 mmHg after doing time-out.  Attention was directed to the proximal and lateral area.  The incision taken through the skin and subcutaneous tissue, IT band was opened.  The femoral button was identified and removed.  The old socket on the femoral side was identified.  Arthroscope portal was established proximal, medial, inferomedial, inferolateral.   Diagnostic arthroscopy revealed __________ to be normal, lateral compartment __________ intact.  Medial compartment __________ was normal. __________found to have a very stable partial thickness tear undersurface of posterior medial meniscus.  Did not require repair as it was fairly stable.  __________attachment.  __________was debrided with motorized shaver. Then put the femoral guide in correct position, put the guidepin, draped through the old socket and a retro socket was performed cleaning out the old graft on the socket removing nicely.  I got FiberWire sutures in place on the tibial side; small incision was made anteromedial tibia through the old incision.  We palpated the old tibial tunnel and __________ medial to that for a socket.  First guiding correct position, guidepin was engaged.  A retro socket was performed, FiberWire suture pulled out leaving the cortex intact.  Both tunnels were nicely cleaned, all debris removed from the joint.  We had chosen a __________ allograft from [employer] semitendinosus.  This is for an all-inside technique being a femoral and tibial socket.  The femoral sutures were then pulled out from lateral femoral cortex and the button was pulled distal and extension was applied to this, and it pulled out against cortex and grafted into the knee joint.  We then flipped the graft, put into a tibial tunnel socket center  fashion.  We had excellent fixation on both sides.  On the tibial side, we then tied it down and put an additional backup button on that side.  __________ PushLock anchor distal.  Now checked the knee.  There was excellent orientation and tension of the graft and excellent fixation both sides after putting the knee through range of motion several times.  Arthroscope was removed and extra sutures removed.  Laterally, the __________was closed Vicryl, subcu __________with skin __________ Steri-Strips applied.  Portals were closed with 4-0 nylon  suture, anterior __________suture.  Steri-Strips were applied, another 20 mL of Sensorcaine was placed in skin edges and joint __________above.  Sterile dressing was applied, TED hose, and ice pack.  No complications.  He tolerated the procedure well, no complications.  He was taken from operating room to PACU in stable condition.  To help the patient positioning, prepping, draping, technical and surgical assistance throughout entire case, wound closure, application of dressing, splint, and also preparation of the graft, Mr. Arsenio Loader, PA-C, assistance was needed throughout entire case.          ______________________________ Erasmo Leventhal, M.D.     RAC/MEDQ  D:  05/16/2013  T:  05/17/2013  Job:  [id number]

## 2013-05-22 ENCOUNTER — Encounter (HOSPITAL_BASED_OUTPATIENT_CLINIC_OR_DEPARTMENT_OTHER): Payer: Self-pay | Admitting: Specialist

## 2013-06-19 ENCOUNTER — Emergency Department (HOSPITAL_COMMUNITY)
Admission: EM | Admit: 2013-06-19 | Discharge: 2013-06-19 | Disposition: A | Discharge status: Home / Self Care | Payer: BC Managed Care – PPO | Source: Home / Self Care | Attending: Family Medicine | Admitting: Family Medicine

## 2013-06-19 ENCOUNTER — Encounter (HOSPITAL_COMMUNITY): Payer: Self-pay | Admitting: Emergency Medicine

## 2013-06-19 DIAGNOSIS — L03211 Cellulitis of face: Secondary | ICD-10-CM

## 2013-06-19 DIAGNOSIS — L0201 Cutaneous abscess of face: Secondary | ICD-10-CM

## 2013-06-19 MED ORDER — DOXYCYCLINE HYCLATE 100 MG PO CAPS: 100.0000 mg | ORAL_CAPSULE | Freq: Two times a day (BID) | ORAL | Status: DC

## 2013-06-19 NOTE — Discharge Instructions (Signed)
Thank you for coming in today. Take doxycycline twice daily for 7-10 days.  Come back if not better.   Cellulitis Cellulitis is an infection of the skin and the tissue beneath it. The infected area is usually red and tender. Cellulitis occurs most often in the arms and lower legs.  CAUSES  Cellulitis is caused by bacteria that enter the skin through cracks or cuts in the skin. The most common types of bacteria that cause cellulitis are Staphylococcus and Streptococcus. SYMPTOMS   Redness and warmth.  Swelling.  Tenderness or pain.  Fever. DIAGNOSIS  Your caregiver can usually determine what is wrong based on a physical exam. Blood tests may also be done. TREATMENT  Treatment usually involves taking an antibiotic medicine. HOME CARE INSTRUCTIONS   Take your antibiotics as directed. Finish them even if you start to feel better.  Keep the infected arm or leg elevated to reduce swelling.  Apply a warm cloth to the affected area up to 4 times per day to relieve pain.  Only take over-the-counter or prescription medicines for pain, discomfort, or fever as directed by your caregiver.  Keep all follow-up appointments as directed by your caregiver. SEEK MEDICAL CARE IF:   You notice red streaks coming from the infected area.  Your red area gets larger or turns dark in color.  Your bone or joint underneath the infected area becomes painful after the skin has healed.  Your infection returns in the same area or another area.  You notice a swollen bump in the infected area.  You develop new symptoms. SEEK IMMEDIATE MEDICAL CARE IF:   You have a fever.  You feel very sleepy.  You develop vomiting or diarrhea.  You have a general ill feeling (malaise) with muscle aches and pains. MAKE SURE YOU:   Understand these instructions.  Will watch your condition.  Will get help right away if you are not doing well or get worse. Document Released: 02/25/2005 Document Revised:  11/17/2011 Document Reviewed: 08/03/2011 [facility] Patient Information 2014 [place] [place].

## 2013-06-19 NOTE — ED Provider Notes (Signed)
[name] is a 23 y.o. male who presents to Urgent Care today for swollen painful pimple upper lip. Present since yesterday. A she attempted to pop a pimple yesterday. He had lip swelling and pain since. He denies any fevers chills nausea vomiting or diarrhea. Pain is mild.   Past Medical History  Diagnosis Date  . ACL (anterior cruciate ligament) tear     RIGHT KNEE  . Right knee meniscal tear    History  Substance Use Topics  . Smoking status: Never Smoker   . Smokeless tobacco: Never Used  . Alcohol Use: No   ROS as above Medications: No current facility-administered medications for this encounter.   Current Outpatient Prescriptions  Medication Sig Dispense Refill  . aspirin EC 325 MG tablet Take 1 tablet (325 mg total) by mouth 2 (two) times daily.  60 tablet  0  . doxycycline (VIBRAMYCIN) 100 MG capsule Take 1 capsule (100 mg total) by mouth 2 (two) times daily.  20 capsule  0    Exam:  BP 131/81  Pulse 82  Temp(Src) 97.3 F (36.3 C) (Oral)  Resp 14  SpO2 100% Gen: Well NAD HEENT: EOMI,  MMM  Lungs: Normal work of breathing. CTABL Heart: RRR no MRG Skin: Erythematous papule upper lip mildly tender to touch. Mild upper lip swelling. Otherwise normal.   Assessment and Plan: 23 y.o. male with folliculitis versus cellulitis. Plan to treat with doxycycline. Followup if not better.  Discussed warning signs or symptoms. Please see discharge instructions. Patient expresses understanding.    Rodolph BongEvan S Liandro Thelin, MD 06/19/13 [phone]

## 2013-06-19 NOTE — ED Notes (Signed)
Pt  Reports  He  Squeezed  A  Pimple   On  The     Top  Of  His  Lip           yest  The  Lip  Then  Became  Swollen

## 2016-04-07 ENCOUNTER — Encounter: Payer: Self-pay | Admitting: Sports Medicine

## 2016-04-07 ENCOUNTER — Ambulatory Visit (INDEPENDENT_AMBULATORY_CARE_PROVIDER_SITE_OTHER): Payer: BC Managed Care – PPO | Admitting: Sports Medicine

## 2016-04-07 DIAGNOSIS — S83511A Sprain of anterior cruciate ligament of right knee, initial encounter: Secondary | ICD-10-CM

## 2016-04-07 NOTE — Progress Notes (Signed)
  [name] - 25 y.o. male MRN [id number]  Date of birth: [birth year]  SUBJECTIVE:  Including CC & ROS.   [facility] of R ACL [facility].  [facility] who saw ACL tear on MRI and advised patient on 3rd ACL reconstruction.  Patient is hesitant to consider 3rd reconstruction and is here to determine if there are other non-surgical options.   Patient was running in 09/2015 and stepped laterally and felt a pop in his R knee.  There was no swelling at the time of injury, and after resting ~15 min, he was able to continue running.  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  He will feel R knee buckle ~1x/wk while active.    He reports going back to playing sooner than recommended after last 2 surgical ACL repairs.  He is unsure if he desires to continue playing semi-pro football or just recreational sports.   HISTORY: Past Medical, Surgical, Social, and Family History Reviewed & Updated per EMR.   Pertinent Historical Findings include: PMSHx -  No medical problems, s/p ACL reconstruction x2 (2013, 2014), R partial medial meniscectomy (2014) PSHx -  Never smoker FHx -  noncontributory Medications - none  DATA REVIEWED: Requesting MRI from [facility]  PHYSICAL EXAM:  VS: BP:117/64  HR: bpm  TEMP: ( )  RESP:   HT:5\' 10"  (177.8 cm)   WT:155 lb (70.3 kg)  BMI:22.3 PHYSICAL EXAM: Gen: NAD, alert, cooperative with exam, well-appearing HEENT: clear conjunctiva, EOMI CV:  no edema, capillary refill brisk  Resp: non-labored, normal speech Skin: no rashes, normal turgor  Neuro: no gross deficits.  Psych:  alert and oriented R knee: quadriceps atrophy visible compared to L, strength intact, no TTP, +anterior drawer and lachmans testing, negative McMurrays, no bruising, swelling or effusion  ASSESSMENT & PLAN:   Right ACL tear Exam consistent with ACL tear Requesting  MRI report from [facility] Long discussion with patient about pros and cons of surgical repair vs conservative management To call patient later this week after MRI results available to discuss further    Erasmo DownerAngela M Donyea Gafford, MD, MPH PGY-3,  [facility] 04/07/2016 11:16 AM   Patient seen and evaluated with the resident. I agree with the above plan of care. I had a long talk with the patient regarding treatment of his anterior cruciate ligament tear. His first anterior cruciate ligament reconstruction was done by Dr. Thomasena Edisollins in 2013. It sounds like it was a hamstring autograft. When he tore that graft, his knee was then reconstructed with an allograft in December 2014. As I explained to the patient, there are pros and cons to proceeding with a third anterior cruciate ligament reconstruction. I explained to him that anterior cruciate ligament reconstruction would not guarantee posttraumatic osteoarthritis at a later date. However, if his knee remains unstable then he does have the potential for doing more damage to his knee over time. I requested the MRI from [facility] (he was seen by Dr. Jerl Santosalldorf). I will call him after I reviewed that MRI. If he decides to proceed with anterior cruciate ligament reconstruction, I think both Dr. Thomasena Edisollins and Dr. Jerl Santosalldorf are excellent choices for surgery. If he decides to try a nonoperative approach then we will put him into physical therapy and I will try to get him an anterior cruciate ligament derotational brace.

## 2016-04-07 NOTE — Assessment & Plan Note (Signed)
Exam consistent with ACL tear Requesting MRI report from [facility] Long discussion with patient about pros and cons of surgical repair vs conservative management To call patient later this week after MRI results available to discuss further

## 2016-04-13 ENCOUNTER — Encounter: Payer: Self-pay | Admitting: Sports Medicine

## 2016-04-13 ENCOUNTER — Telehealth: Payer: Self-pay | Admitting: Sports Medicine

## 2016-04-13 NOTE — Telephone Encounter (Signed)
I spoke with the patient last week on the phone after reviewing the records, including a recent MRI of his right knee. Remarkably, the MRI shows no concomitant injury to the meniscus or articular cartilage. It is remarkable that he has not done more damage to his knee given the fact that he has had 3 anterior cruciate ligament tears. I've recommended that he follow-up with Dr. Jerl Santosalldorf to discuss anterior cruciate ligament reconstruction. He understands that he will need to aggressively regain strength in his right leg both preoperatively and postoperatively. He also understands that he will have to follow postoperative instructions in order to get himself the best opportunity for a good post surgical outcome. Follow-up with me as needed.

## 2016-04-17 ENCOUNTER — Encounter: Payer: Self-pay | Admitting: Sports Medicine

## 2016-04-30 ENCOUNTER — Encounter: Payer: Self-pay | Admitting: *Deleted

## 2016-04-30 ENCOUNTER — Encounter: Payer: Self-pay | Admitting: Sports Medicine

## 2016-04-30 NOTE — Progress Notes (Signed)
Dr. Derrill [name] [address] [place], [place] [address] Wednesday, December 6th at 10:20am Arrival time is 10am for the new patient registration Phone number: [phone]  Will fax records to Dr. Creed CopperJaneway at [phone]

## 2016-06-18 ENCOUNTER — Ambulatory Visit (INDEPENDENT_AMBULATORY_CARE_PROVIDER_SITE_OTHER): Payer: Self-pay | Admitting: Orthopedic Surgery

## 2016-06-29 ENCOUNTER — Encounter (INDEPENDENT_AMBULATORY_CARE_PROVIDER_SITE_OTHER): Payer: Self-pay | Admitting: Orthopedic Surgery

## 2016-06-29 ENCOUNTER — Ambulatory Visit (INDEPENDENT_AMBULATORY_CARE_PROVIDER_SITE_OTHER): Payer: BC Managed Care – PPO | Admitting: Orthopedic Surgery

## 2016-06-29 ENCOUNTER — Ambulatory Visit (INDEPENDENT_AMBULATORY_CARE_PROVIDER_SITE_OTHER): Payer: Self-pay

## 2016-06-29 DIAGNOSIS — S83511A Sprain of anterior cruciate ligament of right knee, initial encounter: Secondary | ICD-10-CM | POA: Diagnosis not present

## 2016-06-29 NOTE — Progress Notes (Signed)
Office Visit Note   Patient: [name]           Date of Birth: [birth year]           MRN: [id number] Visit Date: 06/29/2016 Requested by: No referring provider defined for this encounter. PCP: No PCP Per Patient  Subjective: Chief Complaint  Patient presents with  . Right Knee - Pain    HPI [name] is a 26 year old patient with long history of right knee issues.  In 2013 he sustained a noncontact football injury and had anterior cruciate ligament reconstruction which was autograft.  He re-tore it 4 months later playing football.  Had hamstring allograft surgery at that time.  Did well until May of last year when he is playing disc off and felt 3 tear of that ligament when he was jumping over a whole.  He works at [employer] and does warrants for a rest.  He likes to play basketball but he doesn't cut when he is playing basketball.  He does describe symptomatically instability when he attempts to cut.  He wants to play sports at high level including city baseball in adult flag football league.  No family history of DVT or pulmonary embolism.  He can sprint but he cannot cut.  He does feel like it will give way on him at times.  MRI scan from last year is reviewed and it does show intact menisci appropriately placed tunnels as well as torn anterior cruciate ligament.  PCL is intact.              Review of Systems All systems reviewed are negative as they relate to the chief complaint within the history of present illness.  Patient denies  fevers or chills.    Assessment & Plan: Visit Diagnoses:  1. Rupture of anterior cruciate ligament of right knee, initial encounter     Plan: Impression is right knee anterior cruciate ligament reconstruction status post primary and revision anterior cruciate ligament per suction done several years ago.  Patient's having symptomatically instability.  No evidence of meniscal damage on the previous scan.  No evidence on exam of collateral ligament  instability or posterior lateral rotatory instability.  Patient does have issues with his hardware particular on the lateral side where there is some crepitus with range of motion.  Plan is for revision anterior cruciate ligament reconstruction using bone patella tendon bone autograft.  Risks and benefits are discussed.  Risks including not limited to infection or vessel damage knee stiffness, possibility of the rerupture is also discussed.  The extensive nature the rehabilitation was discussed.  All questions answered.  Follow-Up Instructions: No Follow-up on file.   Orders:  Orders Placed This Encounter  Procedures  . XR KNEE 3 VIEW RIGHT   No orders of the defined types were placed in this encounter.     Procedures: No procedures performed   Clinical Data: No additional findings.  Objective: Vital Signs: There were no vitals taken for this visit.  Physical Exam   Constitutional: Patient appears well-developed HEENT:  Head: Normocephalic Eyes:EOM are normal Neck: Normal range of motion Cardiovascular: Normal rate Pulmonary/chest: Effort normal Neurologic: Patient is alert Skin: Skin is warm Psychiatric: Patient has normal mood and affect    Ortho Exam examination of the right knee demonstrates well-healed surgical incisions a lot of crepitus with range of motion over that lateral condyle where there is some grinding over the iliotibial band.  Anterior cruciate ligament is out there is no  posterior lateral rotatory instability PCL is intact no real joint line tenderness is present pedal pulses palpable alignment otherwise normal.  Specialty Comments:  No specialty comments available.  Imaging: Xr Knee 3 View Right  Result Date: 06/29/2016 3 views right knee reviewed.  Hardware present Endo button on the lateral femoral condyle oval manhole cover present on the medial tibial plateau.  Bone tunnels not expanded.  No ossified loose bodies.  No joint space narrowing.  Patella  well centered in the trochlear groove which is normally developed.    PMFS History: Patient Active Problem List   Diagnosis Date Noted  . Right ACL tear 04/07/2016  . S/P ACL surgery 05/16/2013  . RHINITIS, ALLERGIC 07/29/2006   Past Medical History:  Diagnosis Date  . ACL (anterior cruciate ligament) tear    RIGHT KNEE  . Right knee meniscal tear     No family history on file.  Past Surgical History:  Procedure Laterality Date  . KNEE ARTHROSCOPY WITH ANTERIOR CRUCIATE LIGAMENT (ACL) REPAIR Right 10-04-2012  . KNEE ARTHROSCOPY WITH ANTERIOR CRUCIATE LIGAMENT (ACL) REPAIR Right 05/16/2013   Procedure: RIGHT KNEE ARTHROSCOPY DEBRIDEMENT PARTIAL MEDIAL  MENISCECTOMY AND ANTERIOR CRUCIATE LIGAMENT (ACL) RECONSTRUCTION ALLOGRAFT REVISION ;  Surgeon: Eugenia Mcalpineobert Collins, MD;  [facility];  Service: Orthopedics;  Laterality: Right;   Social History   Occupational History  . Not on file.   Social History Main Topics  . Smoking status: Never Smoker  . Smokeless tobacco: Never Used  . Alcohol use No  . Drug use: No  . Sexual activity: Not on file

## 2016-07-01 ENCOUNTER — Other Ambulatory Visit (INDEPENDENT_AMBULATORY_CARE_PROVIDER_SITE_OTHER): Payer: Self-pay | Admitting: Orthopedic Surgery

## 2016-07-01 DIAGNOSIS — S83511D Sprain of anterior cruciate ligament of right knee, subsequent encounter: Secondary | ICD-10-CM

## 2016-07-10 ENCOUNTER — Encounter (HOSPITAL_COMMUNITY)
Admission: RE | Admit: 2016-07-10 | Discharge: 2016-07-10 | Disposition: A | Discharge status: Home / Self Care | Payer: BC Managed Care – PPO | Source: Ambulatory Visit | Attending: Orthopedic Surgery | Admitting: Orthopedic Surgery

## 2016-07-10 ENCOUNTER — Other Ambulatory Visit (HOSPITAL_COMMUNITY): Payer: Self-pay | Admitting: *Deleted

## 2016-07-10 ENCOUNTER — Encounter (HOSPITAL_COMMUNITY): Payer: Self-pay

## 2016-07-10 DIAGNOSIS — Z01818 Encounter for other preprocedural examination: Secondary | ICD-10-CM | POA: Diagnosis present

## 2016-07-10 DIAGNOSIS — Z01812 Encounter for preprocedural laboratory examination: Secondary | ICD-10-CM | POA: Diagnosis not present

## 2016-07-10 HISTORY — DX: Other specified health status: Z78.9

## 2016-07-10 LAB — BASIC METABOLIC PANEL
ANION GAP: 9 (ref 5–15)
BUN: 14 mg/dL (ref 6–20)
CHLORIDE: 106 mmol/L (ref 101–111)
CO2: 25 mmol/L (ref 22–32)
CREATININE: 1.11 mg/dL (ref 0.61–1.24)
Calcium: 9.6 mg/dL (ref 8.9–10.3)
GFR calc non Af Amer: >60 mL/min (ref >60)
Glucose, Bld: 103 mg/dL — ABNORMAL HIGH (ref 65–99)
Potassium: 3.7 mmol/L (ref 3.5–5.1)
SODIUM: 140 mmol/L (ref 135–145)

## 2016-07-10 LAB — CBC
HCT: 44.5 % (ref 39.0–52.0)
HEMOGLOBIN: 14.7 g/dL (ref 13.0–17.0)
MCH: 29.1 pg (ref 26.0–34.0)
MCHC: 33 g/dL (ref 30.0–36.0)
MCV: 88.1 fL (ref 78.0–100.0)
PLATELETS: 192 10*3/uL (ref 150–400)
RBC: 5.05 MIL/uL (ref 4.22–5.81)
RDW: 13.7 % (ref 11.5–15.5)
WBC: 6.7 10*3/uL (ref 4.0–10.5)

## 2016-07-10 NOTE — Progress Notes (Signed)
PCP is [facility] Denies seeing a cardiologist. Denies ever having a stress test, echo, or card cath.

## 2016-07-10 NOTE — Pre-Procedure Instructions (Signed)
[facility]  07/10/2016      [id number] [place], [place] - [address] [place] [address] Phone: [phone] Fax: [phone]  [id number] - [place], [place] - [address] AT [address] [place] [address] Phone: [phone] Fax: [phone]    Your procedure is scheduled on 07-16-2016  Thursday   Report to [facility] Admitting at 9:30 A.M.   Call this number if you have problems the morning of surgery:  [phone]   Remember:  Do not eat food or drink liquids after midnight.   Take these medicines the morning of surgery with A SIP OF WATER none             STOP ASPIRIN,ANTIINFLAMATORIES (IBUPROFEN,ALEVE,MOTRIN,ADVIL,GOODY'S POWDERS),HERBAL SUPPLEMENTS,FISH OIL,AND VITAMINS 5-7 DAYS PRIOR TO SURGERY   Do not wear jewelry,.  Do not wear lotions, powders, or perfumes, or deoderant.  Do not shave 48 hours prior to surgery.  Men may shave face and neck.   Do not bring valuables to the hospital.  [facility] is not responsible for any belongings or valuables.  Contacts, dentures or bridgework may not be worn into surgery.  Leave your suitcase in the car.  After surgery it may be brought to your room.  For patients admitted to the hospital, discharge time will be determined by your treatment team.  Patients discharged the day of surgery will not be allowed to drive home.     [place] - Preparing for Surgery  Before surgery, you can play an important role.  Because skin is not sterile, your skin needs to be as free of germs as possible.  You can reduce the number of germs on you skin by washing with CHG (chlorahexidine gluconate) soap before surgery.  CHG is an antiseptic cleaner which kills germs and bonds with the skin to continue killing germs even after washing.  Please DO NOT use if you have an allergy to CHG or  antibacterial soaps.  If your skin becomes reddened/irritated stop using the CHG and inform your nurse when you arrive at Short Stay.  Do not shave (including legs and underarms) for at least 48 hours prior to the first CHG shower.  You may shave your face.  Please follow these instructions carefully:   1.  Shower with CHG Soap the night before surgery and the                                morning of Surgery.  2.  If you choose to wash your hair, wash your hair first as usual with your       normal shampoo.  3.  After you shampoo, rinse your hair and body thoroughly to remove the                      Shampoo.  4.  Use CHG as you would any other liquid soap.  You can apply chg directly to the skin and wash gently with scrungie or a clean washcloth.  5.  Apply the CHG Soap to your body ONLY FROM THE NECK DOWN.        Do not use on open wounds or open sores.  Avoid contact with your eyes,       ears, mouth and genitals (private parts).  Wash genitals (private parts)       with your normal soap.  6.  Wash thoroughly, paying special attention to the area where your surgery will be performed.  7.  Thoroughly rinse your body with warm water from the neck down.  8.  DO NOT shower/wash with your normal soap after using and rinsing off       the CHG Soap.  9.  Pat yourself dry with a clean towel.            10.  Wear clean pajamas.            11.  Place clean sheets on your bed the night of your first shower and do not        sleep with pets.  Day of Surgery  Do not apply any lotions/deoderants the morning of surgery.  Please wear clean clothes to the hospital/surgery center.      Please read over the following fact sheets that you were given. Pain Booklet, Coughing and Deep Breathing and Surgical Site Infection Prevention

## 2016-07-16 ENCOUNTER — Encounter (HOSPITAL_COMMUNITY): Payer: Self-pay | Admitting: *Deleted

## 2016-07-16 ENCOUNTER — Ambulatory Visit (HOSPITAL_COMMUNITY)
Admission: RE | Admit: 2016-07-16 | Discharge: 2016-07-16 | Disposition: A | Discharge status: Home / Self Care | Payer: BC Managed Care – PPO | Source: Ambulatory Visit | Attending: Orthopedic Surgery | Admitting: Orthopedic Surgery

## 2016-07-16 ENCOUNTER — Encounter (HOSPITAL_COMMUNITY)
Admission: RE | Disposition: A | Discharge status: Home / Self Care | Payer: Self-pay | Source: Ambulatory Visit | Attending: Orthopedic Surgery

## 2016-07-16 ENCOUNTER — Ambulatory Visit (HOSPITAL_COMMUNITY): Payer: BC Managed Care – PPO | Admitting: Certified Registered Nurse Anesthetist

## 2016-07-16 DIAGNOSIS — S83241D Other tear of medial meniscus, current injury, right knee, subsequent encounter: Secondary | ICD-10-CM

## 2016-07-16 DIAGNOSIS — S83511A Sprain of anterior cruciate ligament of right knee, initial encounter: Secondary | ICD-10-CM | POA: Insufficient documentation

## 2016-07-16 DIAGNOSIS — S83241A Other tear of medial meniscus, current injury, right knee, initial encounter: Secondary | ICD-10-CM | POA: Insufficient documentation

## 2016-07-16 DIAGNOSIS — S83511D Sprain of anterior cruciate ligament of right knee, subsequent encounter: Secondary | ICD-10-CM

## 2016-07-16 DIAGNOSIS — Z7982 Long term (current) use of aspirin: Secondary | ICD-10-CM | POA: Insufficient documentation

## 2016-07-16 DIAGNOSIS — X58XXXA Exposure to other specified factors, initial encounter: Secondary | ICD-10-CM | POA: Diagnosis not present

## 2016-07-16 DIAGNOSIS — Y9362 Activity, american flag or touch football: Secondary | ICD-10-CM | POA: Diagnosis not present

## 2016-07-16 DIAGNOSIS — S83511S Sprain of anterior cruciate ligament of right knee, sequela: Secondary | ICD-10-CM | POA: Diagnosis not present

## 2016-07-16 HISTORY — PX: ANTERIOR CRUCIATE LIGAMENT REPAIR: SHX115

## 2016-07-16 SURGERY — RECONSTRUCTION, KNEE, ACL, USING HAMSTRING GRAFT
Anesthesia: Regional | Site: Knee | Laterality: Right

## 2016-07-16 MED ORDER — CEFAZOLIN SODIUM 1 G IJ SOLR
INTRAMUSCULAR | Status: AC
  Filled 2016-07-16: qty 20

## 2016-07-16 MED ORDER — ONDANSETRON HCL 4 MG/2ML IJ SOLN
INTRAMUSCULAR | Status: DC | PRN
  Administered 2016-07-16: 4 mg via INTRAVENOUS

## 2016-07-16 MED ORDER — BUPIVACAINE-EPINEPHRINE (PF) 0.5% -1:200000 IJ SOLN
INTRAMUSCULAR | Status: AC
  Filled 2016-07-16: qty 30

## 2016-07-16 MED ORDER — PHENYLEPHRINE 40 MCG/ML (10ML) SYRINGE FOR IV PUSH (FOR BLOOD PRESSURE SUPPORT)
PREFILLED_SYRINGE | INTRAVENOUS | Status: DC | PRN
  Administered 2016-07-16: 80 ug via INTRAVENOUS
  Administered 2016-07-16: 40 ug via INTRAVENOUS
  Administered 2016-07-16: 80 ug via INTRAVENOUS
  Administered 2016-07-16: 40 ug via INTRAVENOUS
  Administered 2016-07-16: 20 ug via INTRAVENOUS
  Administered 2016-07-16: 120 ug via INTRAVENOUS
  Administered 2016-07-16: 40 ug via INTRAVENOUS
  Administered 2016-07-16: 80 ug via INTRAVENOUS
  Administered 2016-07-16 (×2): 40 ug via INTRAVENOUS
  Administered 2016-07-16: 20 ug via INTRAVENOUS
  Administered 2016-07-16 (×2): 40 ug via INTRAVENOUS
  Administered 2016-07-16: 20 ug via INTRAVENOUS

## 2016-07-16 MED ORDER — CEFAZOLIN SODIUM-DEXTROSE 2-4 GM/100ML-% IV SOLN
2.0000 g | INTRAVENOUS | Status: AC
  Administered 2016-07-16 (×2): 2 g via INTRAVENOUS

## 2016-07-16 MED ORDER — LACTATED RINGERS IV SOLN
INTRAVENOUS | Status: DC
Start: 2016-07-16 — End: 2016-07-16
  Administered 2016-07-16 (×2): via INTRAVENOUS

## 2016-07-16 MED ORDER — EPINEPHRINE PF 1 MG/ML IJ SOLN
INTRAMUSCULAR | Status: DC | PRN
  Administered 2016-07-16 (×3): 1 mg

## 2016-07-16 MED ORDER — ONDANSETRON 4 MG PO TBDP
4.0000 mg | ORAL_TABLET | Freq: Once | ORAL | Status: AC
  Administered 2016-07-16: 4 mg via ORAL
  Filled 2016-07-16: qty 1

## 2016-07-16 MED ORDER — PROPOFOL 10 MG/ML IV BOLUS
INTRAVENOUS | Status: DC | PRN
  Administered 2016-07-16: 300 mg via INTRAVENOUS

## 2016-07-16 MED ORDER — PROPOFOL 10 MG/ML IV BOLUS
INTRAVENOUS | Status: AC
  Filled 2016-07-16: qty 40

## 2016-07-16 MED ORDER — FENTANYL CITRATE (PF) 100 MCG/2ML IJ SOLN
INTRAMUSCULAR | Status: AC
  Filled 2016-07-16: qty 2

## 2016-07-16 MED ORDER — CHLORHEXIDINE GLUCONATE 4 % EX LIQD: 60.0000 mL | Freq: Once | CUTANEOUS | Status: DC

## 2016-07-16 MED ORDER — MORPHINE SULFATE (PF) 4 MG/ML IV SOLN
INTRAVENOUS | Status: AC
  Filled 2016-07-16: qty 2

## 2016-07-16 MED ORDER — FENTANYL CITRATE (PF) 100 MCG/2ML IJ SOLN
INTRAMUSCULAR | Status: DC | PRN
  Administered 2016-07-16: 25 ug via INTRAVENOUS
  Administered 2016-07-16: 50 ug via INTRAVENOUS
  Administered 2016-07-16 (×4): 25 ug via INTRAVENOUS
  Administered 2016-07-16: 50 ug via INTRAVENOUS
  Administered 2016-07-16: 25 ug via INTRAVENOUS
  Administered 2016-07-16: 50 ug via INTRAVENOUS

## 2016-07-16 MED ORDER — FENTANYL CITRATE (PF) 100 MCG/2ML IJ SOLN
INTRAMUSCULAR | Status: AC
  Filled 2016-07-16: qty 4

## 2016-07-16 MED ORDER — BUPIVACAINE-EPINEPHRINE 0.5% -1:200000 IJ SOLN
INTRAMUSCULAR | Status: DC | PRN
  Administered 2016-07-16: 20 mL

## 2016-07-16 MED ORDER — EPINEPHRINE PF 1 MG/ML IJ SOLN
INTRAMUSCULAR | Status: AC
  Filled 2016-07-16: qty 3

## 2016-07-16 MED ORDER — MIDAZOLAM HCL 5 MG/5ML IJ SOLN
INTRAMUSCULAR | Status: DC | PRN
  Administered 2016-07-16: 2 mg via INTRAVENOUS

## 2016-07-16 MED ORDER — CLONIDINE HCL (ANALGESIA) 100 MCG/ML EP SOLN
150.0000 ug | Freq: Once | EPIDURAL | Status: DC
  Filled 2016-07-16: qty 1.5

## 2016-07-16 MED ORDER — SODIUM CHLORIDE 0.9 % IR SOLN
Status: DC | PRN
Start: 2016-07-16 — End: 2016-07-16
  Administered 2016-07-16 (×7): 3000 mL

## 2016-07-16 MED ORDER — DEXAMETHASONE SODIUM PHOSPHATE 10 MG/ML IJ SOLN
INTRAMUSCULAR | Status: DC | PRN
  Administered 2016-07-16: 10 mg via INTRAVENOUS

## 2016-07-16 MED ORDER — CLONIDINE HCL (ANALGESIA) 100 MCG/ML EP SOLN
EPIDURAL | Status: DC | PRN
Start: 2016-07-16 — End: 2016-07-16
  Administered 2016-07-16: .75 mL via INTRA_ARTICULAR

## 2016-07-16 MED ORDER — CEFAZOLIN SODIUM-DEXTROSE 2-4 GM/100ML-% IV SOLN
INTRAVENOUS | Status: AC
  Filled 2016-07-16: qty 100

## 2016-07-16 MED ORDER — MIDAZOLAM HCL 2 MG/2ML IJ SOLN
INTRAMUSCULAR | Status: AC
  Filled 2016-07-16: qty 2

## 2016-07-16 MED ORDER — BUPIVACAINE-EPINEPHRINE (PF) 0.5% -1:200000 IJ SOLN
INTRAMUSCULAR | Status: DC | PRN
  Administered 2016-07-16: 30 mL via PERINEURAL

## 2016-07-16 MED ORDER — 0.9 % SODIUM CHLORIDE (POUR BTL) OPTIME
TOPICAL | Status: DC | PRN
  Administered 2016-07-16: 1000 mL

## 2016-07-16 MED ORDER — LIDOCAINE 2% (20 MG/ML) 5 ML SYRINGE
INTRAMUSCULAR | Status: DC | PRN
  Administered 2016-07-16: 100 mg via INTRAVENOUS

## 2016-07-16 MED ORDER — MORPHINE SULFATE (PF) 4 MG/ML IV SOLN
INTRAVENOUS | Status: DC | PRN
  Administered 2016-07-16: 8 mg via INTRAVENOUS

## 2016-07-16 SURGICAL SUPPLY — 113 items
ALCOHOL 70% 16 OZ (MISCELLANEOUS) ×3 IMPLANT
ANCHOR BUTTON TIGHTROPE BTB (Anchor) ×3 IMPLANT
ANCHOR SUT BIO SW 4.75X19.1 (Anchor) ×6 IMPLANT
BANDAGE ACE 6X5 VEL STRL LF (GAUZE/BANDAGES/DRESSINGS) ×3 IMPLANT
BANDAGE ESMARK 6X9 LF (GAUZE/BANDAGES/DRESSINGS) ×1 IMPLANT
BIT DRILL 5/64X5 DISP (BIT) ×3 IMPLANT
BLADE CUTTER GATOR 3.5 (BLADE) IMPLANT
BLADE GREAT WHITE 4.2 (BLADE) ×2 IMPLANT
BLADE GREAT WHITE 4.2MM (BLADE) ×1
BLADE LONG MED 31MMX9MM (MISCELLANEOUS) ×1
BLADE LONG MED 31X9 (MISCELLANEOUS) ×2 IMPLANT
BLADE OSCIL/SAGITTAL W/10 ST (BLADE) IMPLANT
BLADE OSCIL/SAGITTAL W/10MM ST (BLADE)
BLADE SURG 10 STRL SS (BLADE) ×3 IMPLANT
BLADE SURG 15 STRL LF DISP TIS (BLADE) ×2 IMPLANT
BLADE SURG 15 STRL SS (BLADE) ×4
BNDG ELASTIC 6X15 VLCR STRL LF (GAUZE/BANDAGES/DRESSINGS) IMPLANT
BNDG ESMARK 6X9 LF (GAUZE/BANDAGES/DRESSINGS) ×3
BONE MATRIX DEMINERALIZED 1CC (Bone Implant) ×6 IMPLANT
BUR OVAL 6.0 (BURR) ×3 IMPLANT
BUTTON EXT TIGHTROPE 5X20 (Orthopedic Implant) ×2 IMPLANT
BUTTON EXT TIGHTROPE 5X20MM (Orthopedic Implant) ×1 IMPLANT
CLOSURE WOUND 1/2 X4 (GAUZE/BANDAGES/DRESSINGS) ×1
COVER MAYO STAND STRL (DRAPES) ×3 IMPLANT
COVER SURGICAL LIGHT HANDLE (MISCELLANEOUS) ×3 IMPLANT
CUFF TOURNIQUET SINGLE 34IN LL (TOURNIQUET CUFF) ×3 IMPLANT
CUFF TOURNIQUET SINGLE 44IN (TOURNIQUET CUFF) IMPLANT
DECANTER SPIKE VIAL GLASS SM (MISCELLANEOUS) ×3 IMPLANT
DRAPE ARTHROSCOPY W/POUCH 114 (DRAPES) ×3 IMPLANT
DRAPE INCISE IOBAN 66X45 STRL (DRAPES) ×3 IMPLANT
DRAPE ORTHO SPLIT 77X108 STRL (DRAPES) ×2
DRAPE SURG ORHT 6 SPLT 77X108 (DRAPES) ×1 IMPLANT
DRAPE U-SHAPE 47X51 STRL (DRAPES) ×3 IMPLANT
DRILL FLIPCUTTER II 10MM (CUTTER) ×1 IMPLANT
DRILL FLIPCUTTER II 7.5MM (MISCELLANEOUS) IMPLANT
DRILL FLIPCUTTER II 8.0MM (INSTRUMENTS) IMPLANT
DRILL FLIPCUTTER II 8.5MM (INSTRUMENTS) IMPLANT
DRILL FLIPCUTTER II 9.0MM (INSTRUMENTS) IMPLANT
DRSG PAD ABDOMINAL 8X10 ST (GAUZE/BANDAGES/DRESSINGS) ×3 IMPLANT
DRSG TEGADERM 4X4.75 (GAUZE/BANDAGES/DRESSINGS) ×3 IMPLANT
DURAPREP 26ML APPLICATOR (WOUND CARE) ×3 IMPLANT
ELECT REM PT RETURN 9FT ADLT (ELECTROSURGICAL) ×3
ELECTRODE REM PT RTRN 9FT ADLT (ELECTROSURGICAL) ×1 IMPLANT
FIBERLOOP 2 0 (SUTURE) ×6 IMPLANT
FILTER STRAW FLUID ASPIR (MISCELLANEOUS) ×3 IMPLANT
FLIPCUTTER II 10MM (CUTTER) ×3
FLIPCUTTER II 7.5MM (MISCELLANEOUS)
FLIPCUTTER II 8.0MM (INSTRUMENTS)
FLIPCUTTER II 8.5MM (INSTRUMENTS)
FLIPCUTTER II 9.0MM (INSTRUMENTS)
GAUZE SPONGE 4X4 12PLY STRL (GAUZE/BANDAGES/DRESSINGS) ×3 IMPLANT
GAUZE XEROFORM 1X8 LF (GAUZE/BANDAGES/DRESSINGS) IMPLANT
GLOVE BIOGEL PI IND STRL 8 (GLOVE) ×1 IMPLANT
GLOVE BIOGEL PI INDICATOR 8 (GLOVE) ×2
GLOVE ORTHO TXT STRL SZ7.5 (GLOVE) ×3 IMPLANT
GLOVE SURG ORTHO 8.0 STRL STRW (GLOVE) ×3 IMPLANT
GOWN STRL REUS W/ TWL LRG LVL3 (GOWN DISPOSABLE) ×6 IMPLANT
GOWN STRL REUS W/TWL LRG LVL3 (GOWN DISPOSABLE) ×12
GRAFT ROPE FROZEN (Tissue) ×3 IMPLANT
IMMOBILIZER KNEE 22 UNIV (SOFTGOODS) ×3 IMPLANT
KIT BASIN OR (CUSTOM PROCEDURE TRAY) ×3 IMPLANT
KIT BIOCARTILAGE DEL W/SYRINGE (KITS) IMPLANT
KIT BIOCARTILAGE LG JOINT MIX (KITS) ×3 IMPLANT
KIT ROOM TURNOVER OR (KITS) ×3 IMPLANT
KIT TRANSTIBIAL (DISPOSABLE) ×3 IMPLANT
KNIFE GRAFT ACL 10MM 5952 (MISCELLANEOUS) ×3 IMPLANT
MANIFOLD NEPTUNE II (INSTRUMENTS) ×3 IMPLANT
NEEDLE 18GX1X1/2 (RX/OR ONLY) (NEEDLE) ×3 IMPLANT
NEEDLE SPNL 18GX3.5 QUINCKE PK (NEEDLE) ×3 IMPLANT
NEEDLE STRAIGHT KEITH (NEEDLE) ×3 IMPLANT
NS IRRIG 1000ML POUR BTL (IV SOLUTION) ×3 IMPLANT
PACK ARTHROSCOPY DSU (CUSTOM PROCEDURE TRAY) ×3 IMPLANT
PAD ARMBOARD 7.5X6 YLW CONV (MISCELLANEOUS) ×6 IMPLANT
PAD CAST 4YDX4 CTTN HI CHSV (CAST SUPPLIES) ×1 IMPLANT
PADDING CAST ABS 6INX4YD NS (CAST SUPPLIES) ×2
PADDING CAST ABS COTTON 6X4 NS (CAST SUPPLIES) ×1 IMPLANT
PADDING CAST COTTON 4X4 STRL (CAST SUPPLIES) ×2
PADDING CAST COTTON 6X4 STRL (CAST SUPPLIES) IMPLANT
PENCIL BUTTON HOLSTER BLD 10FT (ELECTRODE) ×3 IMPLANT
PK GRAFTLINK AUTO IMPLANT SYST (Anchor) ×3 IMPLANT
REAMER C 10MM (INSTRUMENTS) ×3 IMPLANT
SCREW BIOCOMPOSITE DELTA 8X28M (Screw) ×3 IMPLANT
SET ARTHROSCOPY TUBING (MISCELLANEOUS) ×2
SET ARTHROSCOPY TUBING LN (MISCELLANEOUS) ×1 IMPLANT
SPONGE GAUZE 4X4 12PLY STER LF (GAUZE/BANDAGES/DRESSINGS) IMPLANT
SPONGE LAP 18X18 X RAY DECT (DISPOSABLE) ×3 IMPLANT
SPONGE LAP 4X18 X RAY DECT (DISPOSABLE) ×3 IMPLANT
STRIP CLOSURE SKIN 1/2X4 (GAUZE/BANDAGES/DRESSINGS) ×2 IMPLANT
SUCTION FRAZIER HANDLE 10FR (MISCELLANEOUS) ×4
SUCTION TUBE FRAZIER 10FR DISP (MISCELLANEOUS) ×2 IMPLANT
SUT 0 FIBERLOOP 38 BLUE TPR ND (SUTURE) ×6
SUT ETHILON 3 0 PS 1 (SUTURE) ×6 IMPLANT
SUT MNCRL AB 3-0 PS2 18 (SUTURE) ×3 IMPLANT
SUT VIC AB 0 CT1 27 (SUTURE) ×4
SUT VIC AB 0 CT1 27XBRD ANBCTR (SUTURE) ×2 IMPLANT
SUT VIC AB 1 CT1 27 (SUTURE) ×2
SUT VIC AB 1 CT1 27XBRD ANBCTR (SUTURE) ×1 IMPLANT
SUT VIC AB 2-0 CT1 27 (SUTURE) ×4
SUT VIC AB 2-0 CT1 TAPERPNT 27 (SUTURE) ×2 IMPLANT
SUT VICRYL 0 UR6 27IN ABS (SUTURE) ×9 IMPLANT
SUTURE 0 FIBERLP 38 BLU TPR ND (SUTURE) ×2 IMPLANT
SYR 30ML LL (SYRINGE) ×3 IMPLANT
SYR 5ML LL (SYRINGE) ×3 IMPLANT
SYR BULB IRRIGATION 50ML (SYRINGE) ×3 IMPLANT
SYR TB 1ML LUER SLIP (SYRINGE) ×9 IMPLANT
SYSTEM GRAFT IMPLANT AUTOGRAFT (Anchor) ×1 IMPLANT
SYSTEM IMPL ANTEROLATERAL LIGA (Anchor) ×3 IMPLANT
TOWEL OR 17X24 6PK STRL BLUE (TOWEL DISPOSABLE) ×3 IMPLANT
TOWEL OR 17X26 10 PK STRL BLUE (TOWEL DISPOSABLE) ×3 IMPLANT
UNDERPAD 30X30 (UNDERPADS AND DIAPERS) ×3 IMPLANT
WAND SERFAS ENERGY SUPER 90 (SURGICAL WAND) IMPLANT
WRAP KNEE MAXI GEL POST OP (GAUZE/BANDAGES/DRESSINGS) ×3 IMPLANT
YANKAUER SUCT BULB TIP NO VENT (SUCTIONS) ×3 IMPLANT

## 2016-07-16 NOTE — Transfer of Care (Signed)
Immediate Anesthesia Transfer of Care Note  [facility]  Procedure(s) Performed: Procedure(s): RIGHT KNEE REVISION, RECONSTRUCTION ANTERIOR CRUCIATE LIGAMENT (ACL) WITH HAMSTRING GRAFT, BONE PATELLA BONE AUTOGRAFT, REMOVAL OF HARDWARE TIMES TWO (Right)  Patient Location: PACU  Anesthesia Type:General and Regional  Level of Consciousness: patient cooperative, drowsy  Airway & Oxygen Therapy: Patient Spontanous Breathing  Post-op Assessment: Report given to RN and Post -op Vital signs reviewed and stable  Post vital signs: Reviewed and stable  Last Vitals:  Vitals:   07/16/16 0931 07/16/16 1712  BP: 117/72 (!) 114/54  Pulse: 65   Resp: 18 18  Temp: 36.6 C 36.5 C    Last Pain:  Vitals:   07/16/16 0931  TempSrc: Oral         Complications: No apparent anesthesia complications

## 2016-07-16 NOTE — H&P (Signed)
[name] is an 26 y.o. male.   Chief Complaint: Right knee pain and instability HPI: [name] is a 26 year old patient with long history of right knee issues.  In 2013 he sustained a noncontact football injury and had anterior cruciate ligament reconstruction which was hamstring autograft.  He re-tore it 4 months later playing football.  Had hamstring allograft surgery at that time.  Did well until May of last year when he is playing disc golf and felt a tear of that ligament when he was jumping over a hole.  He works at [employer] and does warrants for arrest.  He likes to play basketball but he doesn't cut when he is playing basketball.  He does describe symptomatically instability when he attempts to cut.  He wants to play sports at high level including city baseball and adult flag football league.  No family history of DVT or pulmonary embolism.  He can sprint but he cannot cut.  He does feel like it will give way on him at times.  MRI scan from last year is reviewed and it does show intact menisci appropriately placed tunnels as well as torn anterior cruciate ligament.  PCL is intact.  Past Medical History:  Diagnosis Date  . ACL (anterior cruciate ligament) tear    RIGHT KNEE  . Medical history non-contributory   . Right knee meniscal tear     Past Surgical History:  Procedure Laterality Date  . KNEE ARTHROSCOPY WITH ANTERIOR CRUCIATE LIGAMENT (ACL) REPAIR Right 10-04-2012  . KNEE ARTHROSCOPY WITH ANTERIOR CRUCIATE LIGAMENT (ACL) REPAIR Right 05/16/2013   Procedure: RIGHT KNEE ARTHROSCOPY DEBRIDEMENT PARTIAL MEDIAL  MENISCECTOMY AND ANTERIOR CRUCIATE LIGAMENT (ACL) RECONSTRUCTION ALLOGRAFT REVISION ;  Surgeon: Eugenia Mcalpine, MD;  Location: [facility];  Service: Orthopedics;  Laterality: Right;    No family history on file. Social History:  reports that he has never smoked. He has never used smokeless tobacco. He reports that he drinks alcohol. He reports that he does  not use drugs.  Allergies: No Known Allergies  Medications Prior to Admission  Medication Sig Dispense Refill  . CREATINE PO Take 1 capsule by mouth 2 (two) times daily.    Marland Kitchen OVER THE COUNTER MEDICATION Take 1 capsule by mouth daily. L-arginine  Testosterone booster    . aspirin EC 325 MG tablet Take 1 tablet (325 mg total) by mouth 2 (two) times daily. (Patient not taking: Reported on 06/29/2016) 60 tablet 0  . doxycycline (VIBRAMYCIN) 100 MG capsule Take 1 capsule (100 mg total) by mouth 2 (two) times daily. (Patient not taking: Reported on 06/29/2016) 20 capsule 0    No results found for this or any previous visit (from the past 48 hour(s)). No results found.  Review of Systems  Musculoskeletal: Positive for joint pain.  All other systems reviewed and are negative.   Blood pressure 117/72, pulse 65, temperature 97.8 F (36.6 C), temperature source Oral, resp. rate 18, height 5\' 9"  (1.753 m), weight 156 lb 4.8 oz (70.9 kg), SpO2 99 %. Physical Exam  Constitutional: He appears well-developed.  HENT:  Head: Normocephalic.  Eyes: Pupils are equal, round, and reactive to light.  Neck: Normal range of motion.  Cardiovascular: Normal rate.   Respiratory: Effort normal.  Neurological: He is alert.  Skin: Skin is warm.  Psychiatric: He has a normal mood and affect.   right knee exam demonstrates well-healed surgical incisions with crepitus laterally with flexion and extension.  This is right over the lateral  epicondyle.  He does not appear to have posterior lateral rotatory instability.  There is a little bit of rotational difference right versus left but it's only around 10.  PCL is intact anterior cruciate ligament as out with positive Lachman's and positive anterior drawer.  Extensor mechanism is intact.  Range of motion is good going from extension to about 140 of flexion.  Collaterals are stable at 0 and 30.  Pedal pulses palpable.  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  He is also having some symptoms over the Endobutton on the lateral epicondyle.  Plan at this time is bone patella tendon bone autograft anterior cruciate ligament revision reconstruction with STEMI blast placed as well as possible anterolateral ligament reconstruction as an extra-articular augment for this patient's knee which appears to be prone to instability based on his activity level.  Menisci appear intact at this time.  Plan for anterior cruciate ligament reconstruction bone patellar tendon bone autograft using foot cutter on the femoral side and tibial side.  We'll try to achieve a different angle than the ones that are currently in place.  Hardware removal will also be performed and we may also supplement his current bone patella tendon bone reconstruction with anterolateral ligament reconstruction with allograft.  Patient understands the risks and benefits.  All questions answered.  Time out of work and activity modification post surgery also discussed.  David BuntingG Scott Mariesa Grieder, MD 07/16/2016, 9:36 AM

## 2016-07-16 NOTE — Anesthesia Preprocedure Evaluation (Addendum)
Anesthesia Evaluation  Patient identified by MRN, date of birth, ID band Patient awake    Reviewed: Allergy & Precautions, H&P , NPO status , Patient's Chart, lab work & pertinent test results  Airway Mallampati: I  TM Distance: >3 FB Neck ROM: Full    Dental no notable dental hx. (+) Teeth Intact, Dental Advisory Given   Pulmonary neg pulmonary ROS,    Pulmonary exam normal breath sounds clear to auscultation       Cardiovascular negative cardio ROS Normal cardiovascular exam Rhythm:Regular Rate:Normal     Neuro/Psych negative neurological ROS  negative psych ROS   GI/Hepatic negative GI ROS, Neg liver ROS,   Endo/Other  negative endocrine ROS  Renal/GU negative Renal ROS     Musculoskeletal negative musculoskeletal ROS (+)   Abdominal   Peds  Hematology negative hematology ROS (+)   Anesthesia Other Findings   Reproductive/Obstetrics                            Anesthesia Physical  Anesthesia Plan  ASA: I  Anesthesia Plan: General   Post-op Pain Management: GA combined w/ Regional for post-op pain   Induction: Intravenous  Airway Management Planned: LMA  Additional Equipment:   Intra-op Plan:   Post-operative Plan: Extubation in OR  Informed Consent: I have reviewed the patients History and Physical, chart, labs and discussed the procedure including the risks, benefits and alternatives for the proposed anesthesia with the patient or authorized representative who has indicated his/her understanding and acceptance.   Dental advisory given  Plan Discussed with: CRNA and Surgeon  Anesthesia Plan Comments:         Anesthesia Quick Evaluation

## 2016-07-16 NOTE — Anesthesia Procedure Notes (Signed)
Anesthesia Regional Block:  Adductor canal block  Pre-Anesthetic Checklist: ,, timeout performed, Correct Patient, Correct Site, Correct Laterality, Correct Procedure, Correct Position, site marked, Risks and benefits discussed,  Surgical consent,  Pre-op evaluation,  At surgeon's request and post-op pain management  Laterality: Right  Prep: chloraprep       Needles:  Injection technique: Single-shot  Needle Type: Stimiplex     Needle Length: 9cm 9 cm Needle Gauge: 21 and 21 G    Additional Needles:  Procedures: ultrasound guided (picture in chart) Adductor canal block Narrative:  Start time: 07/16/2016 11:54 AM End time: 07/16/2016 11:58 AM Injection made incrementally with aspirations every 5 mL.  Performed by: Personally  Anesthesiologist: Lewie LoronGERMEROTH, Tryton Bodi  Additional Notes: BP cuff, EKG monitors applied. Sedation begun. Artery and nerve location verified with U/S and anesthetic injected incrementally, slowly, and after negative aspirations under direct u/s guidance. Good fascial /perineural spread. Tolerated well.

## 2016-07-16 NOTE — Anesthesia Postprocedure Evaluation (Addendum)
Anesthesia Post Note  Patient: [name]  Procedure(s) Performed: Procedure(s) (LRB): RIGHT KNEE REVISION, RECONSTRUCTION ANTERIOR CRUCIATE LIGAMENT (ACL) WITH HAMSTRING GRAFT, BONE PATELLA BONE AUTOGRAFT, REMOVAL OF HARDWARE TIMES TWO (Right)  Patient location during evaluation: PACU Anesthesia Type: General and Regional Level of consciousness: awake and alert Pain management: pain level controlled Vital Signs Assessment: post-procedure vital signs reviewed and stable Respiratory status: spontaneous breathing, nonlabored ventilation, respiratory function stable and patient connected to nasal cannula oxygen Cardiovascular status: blood pressure returned to baseline and stable Postop Assessment: no signs of nausea or vomiting Anesthetic complications: no       Last Vitals:  Vitals:   07/16/16 0931 07/16/16 1712  BP: 117/72 (!) 114/54  Pulse: 65   Resp: 18 18  Temp: 36.6 C 36.5 C    Last Pain:  Vitals:   07/16/16 1712  TempSrc:   PainSc: Asleep                 Cecile HearingStephen Edward Turk

## 2016-07-16 NOTE — Anesthesia Procedure Notes (Signed)
Procedure Name: LMA Insertion Date/Time: 07/16/2016 12:24 PM Performed by: Daiva EvesAVENEL, Ahmod Gillespie W Pre-anesthesia Checklist: Patient identified, Emergency Drugs available, Patient being monitored, Suction available and Timeout performed Patient Re-evaluated:Patient Re-evaluated prior to inductionOxygen Delivery Method: Circle system utilized Preoxygenation: Pre-oxygenation with 100% oxygen Intubation Type: IV induction Ventilation: Mask ventilation without difficulty LMA: LMA inserted LMA Size: 5.0 Number of attempts: 1 Placement Confirmation: breath sounds checked- equal and bilateral and positive ETCO2 Tube secured with: Tape Dental Injury: Teeth and Oropharynx as per pre-operative assessment

## 2016-07-16 NOTE — Brief Op Note (Signed)
07/16/2016  4:41 PM  PATIENT:  [name]  26 y.o. male  PRE-OPERATIVE DIAGNOSIS:  RIGHT KNEE ANTERIOR CRUCIATE LIGAMENT TEAR  POST-OPERATIVE DIAGNOSIS:  RIGHT KNEE ANTERIOR CRUCIATE LIGAMENT TEAR  PROCEDURE:  Procedure(s): RIGHT KNEE REVISION, RECONSTRUCTION ANTERIOR CRUCIATE LIGAMENT (ACL) WITH  BONE PATELLA BONE AUTOGRAFT, REMOVAL OF HARDWARE TIMES TWO  - partial medial meniscectomy and anterior lateral ligament reconstruction  SURGEON:  Surgeon(s): Cammy CopaScott Adeleigh Barletta, MD  ASSISTANT: Patrick Jupiterarla Bethune rnfa  ANESTHESIA:   general  EBL: 35 ml    Total I/O In: 1500 [I.V.:1500] Out: 25 [Blood:25]  BLOOD ADMINISTERED: none  DRAINS: none   LOCAL MEDICATIONS USED:  Marcaine morphine and clonidine  SPECIMEN:  No Specimen  COUNTS:  YES  TOURNIQUET:   Total Tourniquet Time Documented: Thigh (Right) - 120 minutes Total: Thigh (Right) - 120 minutes   DICTATION: .Other Dictation: Dictation Number done  PLAN OF CARE: Discharge to home after PACU  PATIENT DISPOSITION:  PACU - hemodynamically stable

## 2016-07-17 NOTE — Addendum Note (Signed)
Addendum  created 07/17/16 2220 by Lewie LoronJohn Twylla Arceneaux, MD   Sign clinical note, SmartForm saved

## 2016-07-20 NOTE — Op Note (Signed)
NAMEDEMPSEY, AHONEN NO.:  [id number]  MEDICAL RECORD NO.:  [id number]  LOCATION:                                 FACILITY:  PHYSICIAN:  Burnard Bunting, MD           DATE OF BIRTH:  DATE OF PROCEDURE: DATE OF DISCHARGE:                              OPERATIVE REPORT   PREOPERATIVE DIAGNOSIS:  Right knee ACL recurrent tear with possible medial meniscal tear.  POSTOPERATIVE DIAGNOSIS:  Right knee ACL recurrent tear with possible medial meniscal tear.  PROCEDURE:  Right knee arthroscopy with partial medial meniscectomy, revision ACL reconstruction using bone-patellar tendon-bone autograft, ipsilateral leg and augmentation with gracilis allograft of the anterior lateral ligament.  SURGEON:  Burnard Bunting, M.D.  ASSISTANT:  Patrick Jupiter, RNFA.  INDICATIONS:  [name] is a 26 year old patient who has had 2 previous ACL reconstructions done elsewhere.  He presents now for operative management of continued knee instability following injury last year.  OPERATIVE FINDINGS: 1. Examination under anesthesia, range of motion 0-145 of flexion with     stability to varus and valgus stress at 0 and 30 degrees, which is     comparable to the left side.  ACL is out.  PCL is intact.  There     was no asymmetric posterolateral rotatory instability noted. 2. Diagnostic and operative arthroscopy.     a.     Intact patellofemoral compartment.     b.     Intact lateral compartment articular cartilage and meniscus.     c.     Torn ACL graft.     d.     Tear of the posterior horn medial meniscus involving about      50% of the anterior-posterior width of the meniscus.  PROCEDURE IN DETAIL:  The patient was brought to the operating room where general anesthetic was induced.  Preoperative IV antibiotics were administered.  Time-out was called.  Right leg was prescrubbed with alcohol and Betadine, allowed to air dry, prepped with DuraPrep solution, and draped in a sterile manner.   [name] was used to cover the operative field.  Leg was then elevated, exsanguinated with the Esmarch wrap.  Tourniquet was inflated to 250 mmHg.  Time-out was called. Anterior incision was made from the inferior pole of patella down to the tibial tubercle.  Skin and subcutaneous tissue were sharply divided. Paratenon was then divided, but maintained for later closure.  Double wide 10 mm knife was then used to harvest a bone-patellar tendon-bone plug.  Patellar defect was bone grafted.  Plug prepared on the back table with EndoButton on the femoral bone plug side.  That was tapered to about 15 mm bone plug for the femoral side and a 20 mm bone plug for the tibial side.  Concurrent with this, the defect in the patella was bone grafted and the tendon was then closed, reapproximated using 0 Vicryl suture.  Patellar paratenon was then closed using also 0 Vicryl suture.  Portals were then established through the harvesting incision anterior inferolateral and anterior inferomedial.  Diagnostic arthroscopy was performed.  The patient had a torn ACL.  Medial compartment  articular cartilage was intact, but the medial meniscus was torn.  This was debrided back to a stable rim using a combination of basket punch and shaver.  The lateral compartment was intact.  At this time with anterior inferolateral and anterior inferomedial portals established, notchplasty was performed.  Over-the-top position was identified.  The residual FiberWire and the tendon were removed.  At this time, the location of the femoral tunnel and tibial tunnel were estimated.  Incision was made laterally in order to facilitate avoidance of the prior tunnel.  The tunnel was then drilled with a flip cutter only slightly posterior to the previous tunnel.  This gave about a 1-2 mm back wall, but it was a shorter tunnel.  Tibial tunnel was then also drilled through the central portion of the ACL footprint.  This was done with a 10-mm  flip cutter.  Hardware was removed from the tibia and femur.  The graft was then passed in tensioned in full extension over a larger EndoButton on the femoral side.  Full tibial tunnel was drilled slightly medial to the prior tibial tunnel.  This was then secured in full extension using an interference screw supplemented by a PushLock. Proximal femoral supplemental fixation also provided with a SwiveLock. Following this, the knee was found to have good stability.  The anterolateral ligament reconstruction was then performed after exposing the lateral aspect of the knee.  The iliotibial band was split and the tendon was secured using SwiveLock about 5 mm proximal and posterior to the FCL attachment site.  The FCL was visualized, palpated, and found to be functionally intact.  It was then attached to a point about 10 mm below the joint surface between the anterior portion of the fibular head and Gerdy's tubercle.  Secured here also with a Programmer, multimediawiveLock.  Tensioned in extension, which gave a good supplemental fixation.  At this time, the tourniquet was released.  Bleeding points encountered were controlled with electrocautery.  Thorough irrigation of both the incision and the knee joints were performed.  The portal incisions were closed using 0 Vicryl.  Skin closed using interrupted inverted 0 Vicryl suture, 2-0 Vicryl suture, and a running 3-0 Monocryl.  Lateral incision was closed by using #1 to reapproximate the iliotibial band, followed by 0 Vicryl suture, 2-0 Vicryl suture, and a 3-0 Monocryl.  Solution of Marcaine, morphine, and clonidine injected into the knee.  The patient tolerated the procedure well without immediate complication.  Bulky wrap knee immobilizer placed.     Burnard BuntingG. Scott Dean, M.D.   ______________________________ Reece AgarG. Dorene GrebeScott Dean, MD    GSD/MEDQ  D:  07/18/2016  T:  07/19/2016  Job:  [id number]

## 2016-07-22 ENCOUNTER — Encounter (HOSPITAL_COMMUNITY): Payer: Self-pay | Admitting: Orthopedic Surgery

## 2016-07-23 ENCOUNTER — Ambulatory Visit (INDEPENDENT_AMBULATORY_CARE_PROVIDER_SITE_OTHER): Payer: BC Managed Care – PPO | Admitting: Orthopedic Surgery

## 2016-07-23 ENCOUNTER — Encounter (INDEPENDENT_AMBULATORY_CARE_PROVIDER_SITE_OTHER): Payer: Self-pay | Admitting: Orthopedic Surgery

## 2016-07-23 DIAGNOSIS — S83511D Sprain of anterior cruciate ligament of right knee, subsequent encounter: Secondary | ICD-10-CM

## 2016-07-23 MED ORDER — METHOCARBAMOL 500 MG PO TABS: 500.0000 mg | ORAL_TABLET | Freq: Three times a day (TID) | ORAL | 0 refills | Status: DC | PRN

## 2016-07-23 MED ORDER — OXYCODONE HCL 5 MG PO TABS: 5.0000 mg | ORAL_TABLET | Freq: Four times a day (QID) | ORAL | 0 refills | Status: DC | PRN

## 2016-07-23 NOTE — Progress Notes (Signed)
   Post-Op Visit Note   Patient: [name]           Date of Birth: Jul 10, 1990           MRN: [id number] Visit Date: 07/23/2016 PCP: No PCP Per Patient   Assessment & Plan:  Chief Complaint:  Chief Complaint  Patient presents with  . Right Knee - Routine Post Op   Visit Diagnoses:  1. Rupture of anterior cruciate ligament of right knee, subsequent encounter     Plan: [name] is a 26 year old patient who is now a week out right knee anterior cruciate ligament revision reconstruction anterolateral ligament reconstruction partial posterior medial meniscectomy.  CPM is at 60.  Taking oxycodone and methocarbamol.  Instructed also to continue taking aspirin.  On exam graft is stable knee effusion is present that aspirated today at about 40 mL out.  No calf tenderness is present.  Continue with weightbearing as tolerated in the knee immobilizer continue with more CPM machine.  Two-week return for recheck on motion.  Expect him to be past 90 of flexion at that time.  His extension looks good today with less than 5 flexion contracture.  Follow-Up Instructions: Return in about 2 weeks (around 08/06/2016).   Orders:  No orders of the defined types were placed in this encounter.  Meds ordered this encounter  Medications  . methocarbamol (ROBAXIN) 500 MG tablet    Sig: Take 1 tablet (500 mg total) by mouth every 8 (eight) hours as needed for muscle spasms.    Dispense:  30 tablet    Refill:  0  . oxyCODONE (ROXICODONE) 5 MG immediate release tablet    Sig: Take 1 tablet (5 mg total) by mouth every 6 (six) hours as needed for severe pain.    Dispense:  60 tablet    Refill:  0    Imaging: No results found.  PMFS History: Patient Active Problem List   Diagnosis Date Noted  . Right ACL tear 04/07/2016  . S/P ACL surgery 05/16/2013  . RHINITIS, ALLERGIC 07/29/2006   Past Medical History:  Diagnosis Date  . ACL (anterior cruciate ligament) tear    RIGHT KNEE  . Medical history  non-contributory   . Right knee meniscal tear     No family history on file.  Past Surgical History:  Procedure Laterality Date  . ANTERIOR CRUCIATE LIGAMENT REPAIR Right 07/16/2016   Procedure: RIGHT KNEE REVISION, RECONSTRUCTION ANTERIOR CRUCIATE LIGAMENT (ACL) WITH HAMSTRING GRAFT, BONE PATELLA BONE AUTOGRAFT, REMOVAL OF HARDWARE TIMES TWO;  Surgeon: Cammy CopaScott Gregory Dean, MD;  Location: [facility] OR;  Service: Orthopedics;  Laterality: Right;  . KNEE ARTHROSCOPY WITH ANTERIOR CRUCIATE LIGAMENT (ACL) REPAIR Right 10-04-2012  . KNEE ARTHROSCOPY WITH ANTERIOR CRUCIATE LIGAMENT (ACL) REPAIR Right 05/16/2013   Procedure: RIGHT KNEE ARTHROSCOPY DEBRIDEMENT PARTIAL MEDIAL  MENISCECTOMY AND ANTERIOR CRUCIATE LIGAMENT (ACL) RECONSTRUCTION ALLOGRAFT REVISION ;  Surgeon: Eugenia Mcalpineobert Collins, MD;  [facility];  Service: Orthopedics;  Laterality: Right;   Social History   Occupational History  . Not on file.   Social History Main Topics  . Smoking status: Never Smoker  . Smokeless tobacco: Never Used  . Alcohol use Yes     Comment: social  . Drug use: No  . Sexual activity: Not on file

## 2016-08-06 ENCOUNTER — Encounter (INDEPENDENT_AMBULATORY_CARE_PROVIDER_SITE_OTHER): Payer: Self-pay | Admitting: Orthopedic Surgery

## 2016-08-06 ENCOUNTER — Ambulatory Visit (INDEPENDENT_AMBULATORY_CARE_PROVIDER_SITE_OTHER): Payer: BC Managed Care – PPO | Admitting: Orthopedic Surgery

## 2016-08-06 DIAGNOSIS — Z9889 Other specified postprocedural states: Secondary | ICD-10-CM

## 2016-08-06 NOTE — Progress Notes (Signed)
   Post-Op Visit Note   Patient: [name]           Date of Birth: 1991-04-21           MRN: [id number] Visit Date: 08/06/2016 PCP: No PCP Per Patient   Assessment & Plan:  Chief Complaint:  Chief Complaint  Patient presents with  . Right Knee - Follow-up   Visit Diagnoses:  1. S/P ACL surgery     Plan: [name] is a 26 year old patient who is now about 3 weeks out right knee anterior cruciate ligament revision with anterolateral ligament reconstruction and partial posterior medial meniscectomy.  On exam he's lacking about 5-7 of full extension he has flexion to 90 graft is stable taking, only for pain.  Do a straight leg raise tenderness CPM machine is at 80.  He is on one crutch occasionally.  Plan at this time is to start physical therapy with 4 week return.  In general Marijean NiemannJaime looks good.  Needs to work on full extension.  I'll see him back in 4 weeks for clinical recheck.  Okay to be progressively increasing his weightbearing on this leg.  Follow-Up Instructions: No Follow-up on file.   Orders:  No orders of the defined types were placed in this encounter.  No orders of the defined types were placed in this encounter.   Imaging: No results found.  PMFS History: Patient Active Problem List   Diagnosis Date Noted  . Right ACL tear 04/07/2016  . S/P ACL surgery 05/16/2013  . RHINITIS, ALLERGIC 07/29/2006   Past Medical History:  Diagnosis Date  . ACL (anterior cruciate ligament) tear    RIGHT KNEE  . Medical history non-contributory   . Right knee meniscal tear     No family history on file.  Past Surgical History:  Procedure Laterality Date  . ANTERIOR CRUCIATE LIGAMENT REPAIR Right 07/16/2016   Procedure: RIGHT KNEE REVISION, RECONSTRUCTION ANTERIOR CRUCIATE LIGAMENT (ACL) WITH HAMSTRING GRAFT, BONE PATELLA BONE AUTOGRAFT, REMOVAL OF HARDWARE TIMES TWO;  Surgeon: Cammy CopaScott Tanajah Boulter, MD;  Location: [facility] OR;  Service: Orthopedics;  Laterality: Right;  . KNEE  ARTHROSCOPY WITH ANTERIOR CRUCIATE LIGAMENT (ACL) REPAIR Right 10-04-2012  . KNEE ARTHROSCOPY WITH ANTERIOR CRUCIATE LIGAMENT (ACL) REPAIR Right 05/16/2013   Procedure: RIGHT KNEE ARTHROSCOPY DEBRIDEMENT PARTIAL MEDIAL  MENISCECTOMY AND ANTERIOR CRUCIATE LIGAMENT (ACL) RECONSTRUCTION ALLOGRAFT REVISION ;  Surgeon: Eugenia Mcalpineobert Collins, MD;  [facility] [place];  Service: Orthopedics;  Laterality: Right;   Social History   Occupational History  . Not on file.   Social History Main Topics  . Smoking status: Never Smoker  . Smokeless tobacco: Never Used  . Alcohol use Yes     Comment: social  . Drug use: No  . Sexual activity: Not on file

## 2016-09-03 ENCOUNTER — Ambulatory Visit (INDEPENDENT_AMBULATORY_CARE_PROVIDER_SITE_OTHER): Payer: BC Managed Care – PPO | Admitting: Orthopedic Surgery

## 2016-09-03 ENCOUNTER — Encounter (INDEPENDENT_AMBULATORY_CARE_PROVIDER_SITE_OTHER): Payer: Self-pay | Admitting: Orthopedic Surgery

## 2016-09-03 DIAGNOSIS — Z9889 Other specified postprocedural states: Secondary | ICD-10-CM

## 2016-09-04 NOTE — Progress Notes (Signed)
   Post-Op Visit Note   Patient: [name]           Date of Birth: 15-Aug-1990           MRN: [id number] Visit Date: 09/03/2016 PCP: No PCP Per Patient   Assessment & Plan:  Chief Complaint:  Chief Complaint  Patient presents with  . Right Knee - Routine Post Op   Visit Diagnoses:  1. S/P ACL surgery     Plan: [name] is a patient who is now 6 out right knee revision anterior cruciate ligament reconstruction.  Surgery done 07/16/2016.  He's been in physical therapy.  Doing leg press balancing exercises elliptical.  On exam he still has some atrophy of the muscles which is anticipated.  Graft is stable.  Range of motion is excellent.  Trace effusion is present.  I'm let him return to work 10/06/2016.  6 week return for me.  Continue what he is doing now but no running cutting or pivoting.  He's be careful at this point while the graft is maturing and revascularizing.  Follow-Up Instructions: Return in about 6 weeks (around 10/15/2016).   Orders:  No orders of the defined types were placed in this encounter.  No orders of the defined types were placed in this encounter.   Imaging: No results found.  PMFS History: Patient Active Problem List   Diagnosis Date Noted  . Right ACL tear 04/07/2016  . S/P ACL surgery 05/16/2013  . RHINITIS, ALLERGIC 07/29/2006   Past Medical History:  Diagnosis Date  . ACL (anterior cruciate ligament) tear    RIGHT KNEE  . Medical history non-contributory   . Right knee meniscal tear     No family history on file.  Past Surgical History:  Procedure Laterality Date  . ANTERIOR CRUCIATE LIGAMENT REPAIR Right 07/16/2016   Procedure: RIGHT KNEE REVISION, RECONSTRUCTION ANTERIOR CRUCIATE LIGAMENT (ACL) WITH HAMSTRING GRAFT, BONE PATELLA BONE AUTOGRAFT, REMOVAL OF HARDWARE TIMES TWO;  Surgeon: Cammy Copa, MD;  Location: [facility] OR;  Service: Orthopedics;  Laterality: Right;  . KNEE ARTHROSCOPY WITH ANTERIOR CRUCIATE LIGAMENT (ACL) REPAIR Right  10-04-2012  . KNEE ARTHROSCOPY WITH ANTERIOR CRUCIATE LIGAMENT (ACL) REPAIR Right 05/16/2013   Procedure: RIGHT KNEE ARTHROSCOPY DEBRIDEMENT PARTIAL MEDIAL  MENISCECTOMY AND ANTERIOR CRUCIATE LIGAMENT (ACL) RECONSTRUCTION ALLOGRAFT REVISION ;  Surgeon: Eugenia Mcalpine, MD;  Location: [facility];  Service: Orthopedics;  Laterality: Right;   Social History   Occupational History  . Not on file.   Social History Main Topics  . Smoking status: Never Smoker  . Smokeless tobacco: Never Used  . Alcohol use Yes     Comment: social  . Drug use: No  . Sexual activity: Not on file

## 2016-10-05 ENCOUNTER — Ambulatory Visit (INDEPENDENT_AMBULATORY_CARE_PROVIDER_SITE_OTHER): Payer: BC Managed Care – PPO | Admitting: Orthopedic Surgery

## 2016-12-31 ENCOUNTER — Encounter (INDEPENDENT_AMBULATORY_CARE_PROVIDER_SITE_OTHER): Payer: Self-pay | Admitting: Orthopedic Surgery

## 2016-12-31 ENCOUNTER — Ambulatory Visit (INDEPENDENT_AMBULATORY_CARE_PROVIDER_SITE_OTHER): Payer: BC Managed Care – PPO | Admitting: Orthopedic Surgery

## 2016-12-31 DIAGNOSIS — S83511D Sprain of anterior cruciate ligament of right knee, subsequent encounter: Secondary | ICD-10-CM | POA: Diagnosis not present

## 2016-12-31 NOTE — Progress Notes (Signed)
Office Visit Note   Patient: [name]           Date of Birth: 1990-09-09           MRN: [id number] Visit Date: 12/31/2016 Requested by: No referring provider defined for this encounter. PCP: Patient, No Pcp Per  Subjective: Chief Complaint  Patient presents with  . Right Knee - Routine Post Op    HPI: [name] is a 26 year old patient who is now 6 months out right knee revision anterior cruciate ligament reconstruction removal of hardware in this lateral corner reconstruction is doing well with no problems.  Has some occasional nonpainful popping on the lateral aspect of the knee.  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  He has been doing physical therapy and straight ahead running.  Not having any issues.  Not taking any medications.              ROS: All systems reviewed are negative as they relate to the chief complaint within the history of present illness.  Patient denies  fevers or chills.   Assessment & Plan: Visit Diagnoses: No diagnosis found.  Plan: Impression is patient's doing well following right knee revision anterior cruciate ligament reconstruction plan is continue with therapy and strengthening.  Follow-up in early October.  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  Follow-Up Instructions: No Follow-up on file.   Orders:  No orders of the defined types were placed in this encounter.  No orders of the defined types were placed in this encounter.     Procedures: No procedures performed   Clinical Data: No additional findings.  Objective: Vital Signs: There were no vitals taken for this visit.  Physical Exam:   Constitutional: Patient appears well-developed HEENT:  Head: Normocephalic Eyes:EOM are normal Neck: Normal range of motion Cardiovascular: Normal rate Pulmonary/chest: Effort normal Neurologic: Patient is alert Skin: Skin is warm Psychiatric: Patient has  normal mood and affect    Ortho Exam: Orthopedic exam demonstrates stable right knee with anterior drawer and Lachman about 2 mm anterior translation with solid endpoint rotatory instability is noted no effusion in the knee quad and hamstring strength is improving range of motion is full  Specialty Comments:  No specialty comments available.  Imaging: No results found.   PMFS History: Patient Active Problem List   Diagnosis Date Noted  . Right ACL tear 04/07/2016  . S/P ACL surgery 05/16/2013  . RHINITIS, ALLERGIC 07/29/2006   Past Medical History:  Diagnosis Date  . ACL (anterior cruciate ligament) tear    RIGHT KNEE  . Medical history non-contributory   . Right knee meniscal tear     No family history on file.  Past Surgical History:  Procedure Laterality Date  . ANTERIOR CRUCIATE LIGAMENT REPAIR Right 07/16/2016   Procedure: RIGHT KNEE REVISION, RECONSTRUCTION ANTERIOR CRUCIATE LIGAMENT (ACL) WITH HAMSTRING GRAFT, BONE PATELLA BONE AUTOGRAFT, REMOVAL OF HARDWARE TIMES TWO;  Surgeon: Cammy CopaScott Gregory Dean, MD;  Location: [facility];  Service: Orthopedics;  Laterality: Right;  . KNEE ARTHROSCOPY WITH ANTERIOR CRUCIATE LIGAMENT (ACL) REPAIR Right 10-04-2012  . KNEE ARTHROSCOPY WITH ANTERIOR CRUCIATE LIGAMENT (ACL) REPAIR Right 05/16/2013   Procedure: RIGHT KNEE ARTHROSCOPY DEBRIDEMENT PARTIAL MEDIAL  MENISCECTOMY AND ANTERIOR CRUCIATE LIGAMENT (ACL) RECONSTRUCTION ALLOGRAFT REVISION ;  Surgeon: Eugenia Mcalpineobert Collins, MD;  [facility];  Service: Orthopedics;  Laterality: Right;   Social History   Occupational History  .  Not on file.   Social History Main Topics  . Smoking status: Never Smoker  . Smokeless tobacco: Never Used  . Alcohol use Yes     Comment: social  . Drug use: No  . Sexual activity: Not on file

## 2017-03-03 ENCOUNTER — Ambulatory Visit (INDEPENDENT_AMBULATORY_CARE_PROVIDER_SITE_OTHER): Payer: BC Managed Care – PPO | Admitting: Orthopedic Surgery

## 2017-03-04 ENCOUNTER — Encounter (INDEPENDENT_AMBULATORY_CARE_PROVIDER_SITE_OTHER): Payer: Self-pay | Admitting: Orthopedic Surgery

## 2017-03-04 ENCOUNTER — Ambulatory Visit (INDEPENDENT_AMBULATORY_CARE_PROVIDER_SITE_OTHER): Payer: BC Managed Care – PPO | Admitting: Orthopedic Surgery

## 2017-03-04 DIAGNOSIS — Z9889 Other specified postprocedural states: Secondary | ICD-10-CM | POA: Diagnosis not present

## 2017-03-06 NOTE — Progress Notes (Signed)
Office Visit Note   Patient: [name]           Date of Birth: February 14, 1991           MRN: [id number] Visit Date: 03/04/2017 Requested by: No referring provider defined for this encounter. PCP: Patient, No Pcp Per  Subjective: Chief Complaint  Patient presents with  . Right Knee - Follow-up    HPI: [name] is a 26 year old patient status post revision right knee anterior cruciate ligament reconstruction with bone patella tendon bone autograft.  Doing well.  Denies any swelling weakness or locking.  He is now 8 months out from surgery.  Doing physical therapy twice a week as well as the gym workout twice a week.  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  He has a girlfriend here who is pregnant.              ROS: All systems reviewed are negative as they relate to the chief complaint within the history of present illness.  Patient denies  fevers or chills.   Assessment & Plan: Visit Diagnoses:  1. S/P ACL surgery     Plan: Impression is patient's doing well following right knee anterior cruciate ligament revision surgery.  I would not encourage him to do any type of running cutting or pivoting activities until at least 4 more months.  He does have a little quad atrophy on the right compared to the left.  Graft feels stable.  I will see him back as needed.  Follow-Up Instructions: Return if symptoms worsen or fail to improve.   Orders:  No orders of the defined types were placed in this encounter.  No orders of the defined types were placed in this encounter.     Procedures: No procedures performed   Clinical Data: No additional findings.  Objective: Vital Signs: There were no vitals taken for this visit.  Physical Exam:   Constitutional: Patient appears well-developed HEENT:  Head: Normocephalic Eyes:EOM are normal Neck: Normal range of motion Cardiovascular: Normal rate Pulmonary/chest: Effort  normal Neurologic: Patient is alert Skin: Skin is warm Psychiatric: Patient has normal mood and affect    Ortho Exam: Orthopedic exam demonstrates normal gait and alignment about a centimeter of quad atrophy on the right compared to left.  Range of motion is intact.  No other masses lymph adenopathy or skin changes noted in the right knee region.  No effusion is present.  Graft is stable both anteriorly to Lachman and anterior drawer testing as well as 2 posterior lateral rotatory instability testing.  Specialty Comments:  No specialty comments available.  Imaging: No results found.   PMFS History: Patient Active Problem List   Diagnosis Date Noted  . Right ACL tear 04/07/2016  . S/P ACL surgery 05/16/2013  . RHINITIS, ALLERGIC 07/29/2006   Past Medical History:  Diagnosis Date  . ACL (anterior cruciate ligament) tear    RIGHT KNEE  . Medical history non-contributory   . Right knee meniscal tear     No family history on file.  Past Surgical History:  Procedure Laterality Date  . ANTERIOR CRUCIATE LIGAMENT REPAIR Right 07/16/2016   Procedure: RIGHT KNEE REVISION, RECONSTRUCTION ANTERIOR CRUCIATE LIGAMENT (ACL) WITH HAMSTRING GRAFT, BONE PATELLA BONE AUTOGRAFT, REMOVAL OF HARDWARE TIMES TWO;  Surgeon: Cammy Copa, MD;  Location: [facility] OR;  Service: Orthopedics;  Laterality: Right;  . KNEE ARTHROSCOPY WITH ANTERIOR CRUCIATE LIGAMENT (ACL) REPAIR Right  10-04-2012  . KNEE ARTHROSCOPY WITH ANTERIOR CRUCIATE LIGAMENT (ACL) REPAIR Right 05/16/2013   Procedure: RIGHT KNEE ARTHROSCOPY DEBRIDEMENT PARTIAL MEDIAL  MENISCECTOMY AND ANTERIOR CRUCIATE LIGAMENT (ACL) RECONSTRUCTION ALLOGRAFT REVISION ;  Surgeon: Eugenia Mcalpine, MD;  Location: [facility];  Service: Orthopedics;  Laterality: Right;   Social History   Occupational History  . Not on file.   Social History Main Topics  . Smoking status: Never Smoker  . Smokeless tobacco: Never Used  . Alcohol use Yes      Comment: social  . Drug use: No  . Sexual activity: Not on file

## 2019-10-09 ENCOUNTER — Ambulatory Visit
Admission: RE | Admit: 2019-10-09 | Discharge: 2019-10-09 | Disposition: A | Discharge status: Home / Self Care | Payer: BC Managed Care – PPO | Source: Ambulatory Visit | Attending: Nurse Practitioner | Admitting: Nurse Practitioner

## 2019-10-09 ENCOUNTER — Other Ambulatory Visit: Payer: Self-pay

## 2019-10-09 ENCOUNTER — Other Ambulatory Visit: Payer: Self-pay | Admitting: Nurse Practitioner

## 2019-10-09 DIAGNOSIS — Z021 Encounter for pre-employment examination: Secondary | ICD-10-CM

## 2019-11-01 ENCOUNTER — Other Ambulatory Visit: Payer: Self-pay

## 2019-11-01 ENCOUNTER — Ambulatory Visit (INDEPENDENT_AMBULATORY_CARE_PROVIDER_SITE_OTHER): Payer: Self-pay | Admitting: Orthopedic Surgery

## 2019-11-01 DIAGNOSIS — G8929 Other chronic pain: Secondary | ICD-10-CM

## 2019-11-01 DIAGNOSIS — M25561 Pain in right knee: Secondary | ICD-10-CM

## 2019-11-02 ENCOUNTER — Encounter: Payer: Self-pay | Admitting: Orthopedic Surgery

## 2019-11-02 NOTE — Progress Notes (Signed)
Office Visit Note   Patient: [name]           Date of Birth: 1991-01-30           MRN: [id number] Visit Date: 11/01/2019 Requested by: No referring provider defined for this encounter. PCP: Patient, No Pcp Per  Subjective: Chief Complaint  Patient presents with  . Right Knee - Follow-up    HPI: Jt is a patient underwent right knee revision ACL reconstruction with bone patella tendon bone allograft.  Also had removal of hardware.  Has been doing well.  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  He has been playing tennis.  Has not been playing football.  Has been doing disc golf.  Occasionally runs.  Wears a sleeve when he participates in sports.  He has had no symptomatic instability.  He has already completed a variety of physical activities leading up to police academy.              ROS: All systems reviewed are negative as they relate to the chief complaint within the history of present illness.  Patient denies  fevers or chills.   Assessment & Plan: Visit Diagnoses:  1. Chronic pain of right knee     Plan: Impression is well-functioning right knee status post revision ACL reconstruction.  He has been doing reasonably well with cutting and pivoting activities.  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  Follow-up with me as needed.  Follow-Up Instructions: No follow-ups on file.   Orders:  No orders of the defined types were placed in this encounter.  No orders of the defined types were placed in this encounter.     Procedures: No procedures performed   Clinical Data: No additional findings.  Objective: Vital Signs: There were no vitals taken for this visit.  Physical Exam:   Constitutional: Patient appears well-developed HEENT:  Head: Normocephalic Eyes:EOM are normal Neck: Normal range of motion Cardiovascular: Normal rate Pulmonary/chest: Effort normal Neurologic: Patient is alert Skin: Skin is  warm Psychiatric: Patient has normal mood and affect    Ortho Exam: Ortho exam demonstrates full range of motion right knee with no effusion.  Collateral crucial ligaments are stable with no posterior lateral rotatory instability.  Extensor mechanism is intact.  Less than a centimeter quad atrophy right versus left.  Incision intact.  Specialty Comments:  No specialty comments available.  Imaging: No results found.   PMFS History: Patient Active Problem List   Diagnosis Date Noted  . Right ACL tear 04/07/2016  . S/P ACL surgery 05/16/2013  . RHINITIS, ALLERGIC 07/29/2006   Past Medical History:  Diagnosis Date  . ACL (anterior cruciate ligament) tear    RIGHT KNEE  . Medical history non-contributory   . Right knee meniscal tear     History reviewed. No pertinent family history.  Past Surgical History:  Procedure Laterality Date  . ANTERIOR CRUCIATE LIGAMENT REPAIR Right 07/16/2016   Procedure: RIGHT KNEE REVISION, RECONSTRUCTION ANTERIOR CRUCIATE LIGAMENT (ACL) WITH HAMSTRING GRAFT, BONE PATELLA BONE AUTOGRAFT, REMOVAL OF HARDWARE TIMES TWO;  Surgeon: Meredith Pel, MD;  Location: [facility];  Service: Orthopedics;  Laterality: Right;  . KNEE ARTHROSCOPY WITH ANTERIOR CRUCIATE LIGAMENT (ACL) REPAIR Right 10-04-2012  . KNEE ARTHROSCOPY WITH ANTERIOR CRUCIATE LIGAMENT (ACL) REPAIR Right 05/16/2013   Procedure: RIGHT KNEE ARTHROSCOPY DEBRIDEMENT PARTIAL MEDIAL  MENISCECTOMY AND ANTERIOR CRUCIATE LIGAMENT (ACL) RECONSTRUCTION ALLOGRAFT REVISION ;  Surgeon: Sydnee Cabal, MD;  Location: [facility];  Service: Orthopedics;  Laterality: Right;   Social History   Occupational History  . Not on file  Tobacco Use  . Smoking status: Never Smoker  . Smokeless tobacco: Never Used  Substance and Sexual Activity  . Alcohol use: Yes    Comment: social  . Drug use: No  . Sexual activity: Not on file

## 2020-12-23 IMAGING — CR DG CHEST 1V
1 series · 1 of 1 positions shown · non-contrast
Comparison: None.

CLINICAL DATA: Pre-employment exam

EXAM:
CHEST  1 VIEW

[w chest pa]
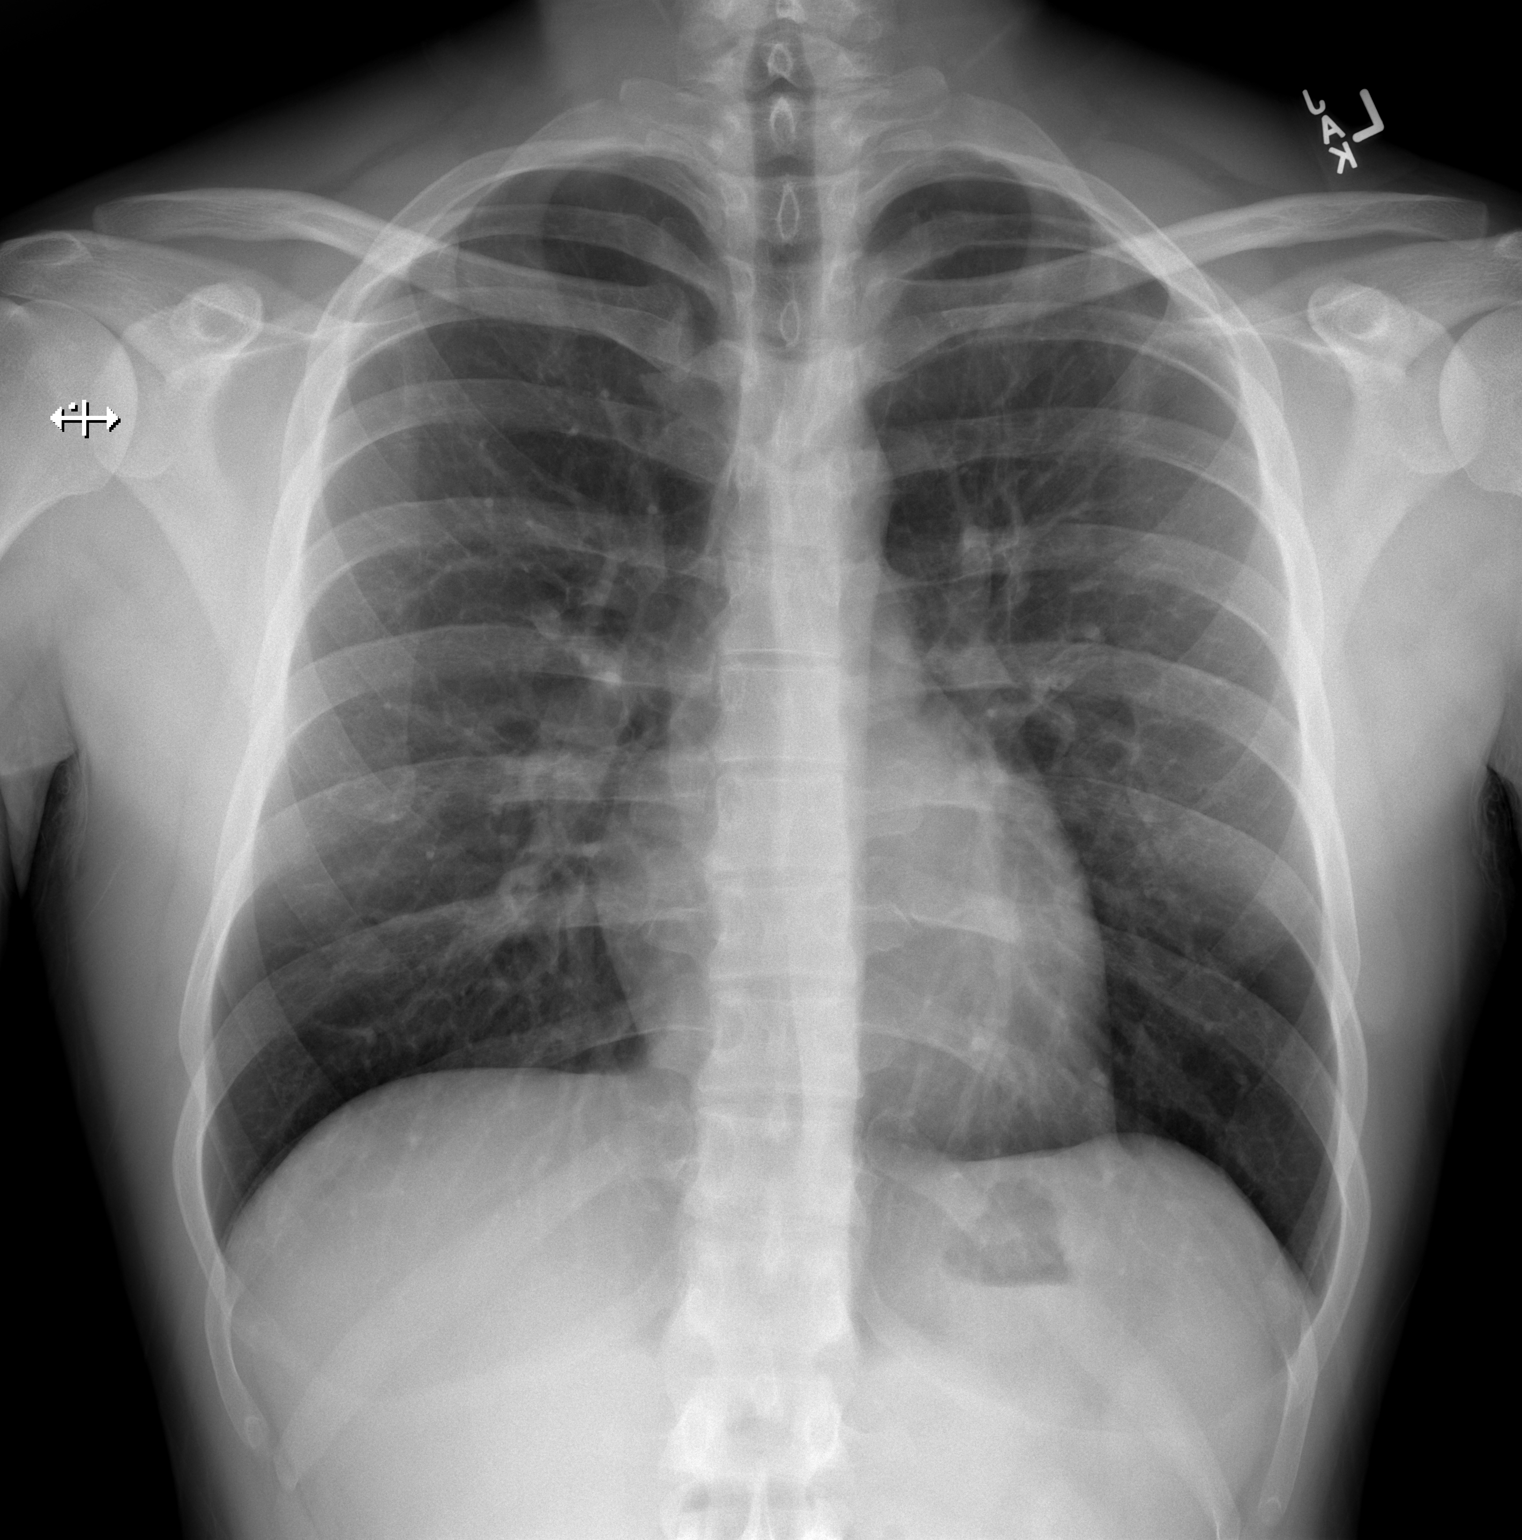

[1 of 1 positions shown; findings below may reference images not displayed]

FINDINGS: The heart size and mediastinal contours are within normal limits.
Both lungs are clear. The visualized skeletal structures are
unremarkable.
IMPRESSION: No active disease.

## 2022-06-02 ENCOUNTER — Ambulatory Visit
Admission: EM | Admit: 2022-06-02 | Discharge: 2022-06-02 | Disposition: A | Discharge status: Home / Self Care | Payer: BC Managed Care – PPO | Attending: Family Medicine | Admitting: Family Medicine

## 2022-06-02 DIAGNOSIS — R1310 Dysphagia, unspecified: Secondary | ICD-10-CM | POA: Diagnosis not present

## 2022-06-02 DIAGNOSIS — J111 Influenza due to unidentified influenza virus with other respiratory manifestations: Secondary | ICD-10-CM

## 2022-06-02 LAB — POCT RAPID STREP A (OFFICE): Rapid Strep A Screen: NEGATIVE

## 2022-06-02 NOTE — ED Triage Notes (Signed)
Pt c/o fever on and off since Wed. Also c/o cough and congestion. Also having pain from cracked tooth of upper RT molar. States causing headaches. Pos flu exposure via grandparents 10 days ago. Taking nyquil, old tamflu rx for 2 days. Still running low grade fever as of today.

## 2022-06-02 NOTE — Discharge Instructions (Addendum)
Strep test is negative You have a viral upper respiratory infection Take Tylenol or ibuprofen for pain and fever May try Chloraseptic spray for throat pain.  Gargle salt water May use over-the-counter cough and cold medicines. Expect improvement over the next few days It can take 7 to 10 days to improve

## 2022-06-02 NOTE — ED Provider Notes (Signed)
[name]    CSN: [id number] Arrival date & time: 06/02/22  1457      History   Chief Complaint Chief Complaint  Patient presents with   Fever   Cough   Nasal Congestion    HPI [name] is a 32 y.o. male.   HPI  Patient states he has been having cough congestion and fever for 6 days.  Still has low-grade fever today.  He states that he has a sore throat and painful swallowing.  Has some stuffy nose.  Body aches and headache.  Past Medical History:  Diagnosis Date   ACL (anterior cruciate ligament) tear    RIGHT KNEE   Medical history non-contributory    Right knee meniscal tear     Patient Active Problem List   Diagnosis Date Noted   Right ACL tear 04/07/2016   S/P ACL surgery 05/16/2013   RHINITIS, ALLERGIC 07/29/2006    Past Surgical History:  Procedure Laterality Date   ANTERIOR CRUCIATE LIGAMENT REPAIR Right 07/16/2016   Procedure: RIGHT KNEE REVISION, RECONSTRUCTION ANTERIOR CRUCIATE LIGAMENT (ACL) WITH HAMSTRING GRAFT, BONE PATELLA BONE AUTOGRAFT, REMOVAL OF HARDWARE TIMES TWO;  Surgeon: Meredith Pel, MD;  Location: [facility];  Service: Orthopedics;  Laterality: Right;   KNEE ARTHROSCOPY WITH ANTERIOR CRUCIATE LIGAMENT (ACL) REPAIR Right 10-04-2012   KNEE ARTHROSCOPY WITH ANTERIOR CRUCIATE LIGAMENT (ACL) REPAIR Right 05/16/2013   Procedure: RIGHT KNEE ARTHROSCOPY DEBRIDEMENT PARTIAL MEDIAL  MENISCECTOMY AND ANTERIOR CRUCIATE LIGAMENT (ACL) RECONSTRUCTION ALLOGRAFT REVISION ;  Surgeon: Sydnee Cabal, MD;  Location: [facility];  Service: Orthopedics;  Laterality: Right;       Home Medications    Prior to Admission medications   Medication Sig Start Date End Date Taking? Authorizing Provider  aspirin EC 325 MG tablet Take 1 tablet (325 mg total) by mouth 2 (two) times daily. 05/16/13   Lajean Manes, PA-C    Family History History reviewed. No pertinent family history.  Social History Social History   Tobacco  Use   Smoking status: Never   Smokeless tobacco: Never  Substance Use Topics   Alcohol use: Yes    Comment: social   Drug use: No     Allergies   Patient has no known allergies.   Review of Systems Review of Systems See HPI  Physical Exam Triage Vital Signs ED Triage Vitals  Enc Vitals Group     BP 06/02/22 1546 125/84     Pulse Rate 06/02/22 1546 76     Resp 06/02/22 1546 17     Temp 06/02/22 1546 98 F (36.7 C)     Temp Source 06/02/22 1546 Oral     SpO2 06/02/22 1546 97 %     Weight --      Height --      Head Circumference --      Peak Flow --      Pain Score 06/02/22 1547 0     Pain Loc --      Pain Edu? --      Excl. in [place]? --    No data found.  Updated Vital Signs BP 125/84 (BP Location: Left Arm)   Pulse 76   Temp 98 F (36.7 C) (Oral)   Resp 17   SpO2 97%       Physical Exam Constitutional:      General: He is not in acute distress.    Appearance: He is well-developed.  HENT:     Head:  Normocephalic and atraumatic.     Right Ear: Tympanic membrane and ear canal normal.     Left Ear: Tympanic membrane and ear canal normal.     Nose: No congestion.     Mouth/Throat:     Mouth: Mucous membranes are moist.     Pharynx: Posterior oropharyngeal erythema present.  Eyes:     Conjunctiva/sclera: Conjunctivae normal.     Pupils: Pupils are equal, round, and reactive to light.  Cardiovascular:     Rate and Rhythm: Normal rate.  Pulmonary:     Effort: Pulmonary effort is normal. No respiratory distress.  Abdominal:     General: There is no distension.     Palpations: Abdomen is soft.  Musculoskeletal:        General: Normal range of motion.     Cervical back: Normal range of motion.  Skin:    General: Skin is warm and dry.  Neurological:     Mental Status: He is alert.      UC Treatments / Results  Labs (all labs ordered are listed, but only abnormal results are displayed) Labs Reviewed  POCT RAPID STREP A (OFFICE)     EKG   Radiology No results found.  Procedures Procedures (including critical care time)  Medications Ordered in UC Medications - No data to display  Initial Impression / Assessment and Plan / UC Course  I have reviewed the triage vital signs and the nursing notes.  Pertinent labs & imaging results that were available during my care of the patient were reviewed by me and considered in my medical decision making (see chart for details).     I told patient that he likely had an influenza.  It was too late to test or treat for it.  It may take some time to get better.  Occasion for antibiotics Final Clinical Impressions(s) / UC Diagnoses   Final diagnoses:  Painful swallowing  Influenza-like illness     Discharge Instructions      Strep test is negative You have a viral upper respiratory infection Take Tylenol or ibuprofen for pain and fever May try Chloraseptic spray for throat pain.  Gargle salt water May use over-the-counter cough and cold medicines. Expect improvement over the next few days It can take 7 to 10 days to improve    ED Prescriptions   None    PDMP not reviewed this encounter.   Raylene Everts, MD 06/02/22 2046

## 2023-10-06 ENCOUNTER — Ambulatory Visit (INDEPENDENT_AMBULATORY_CARE_PROVIDER_SITE_OTHER): Admitting: Radiology

## 2023-10-06 ENCOUNTER — Ambulatory Visit: Admitting: Radiology

## 2023-10-06 ENCOUNTER — Other Ambulatory Visit: Payer: Self-pay

## 2023-10-06 ENCOUNTER — Ambulatory Visit
Admission: EM | Admit: 2023-10-06 | Discharge: 2023-10-06 | Disposition: A | Discharge status: Home / Self Care | Attending: Physician Assistant | Admitting: Physician Assistant

## 2023-10-06 DIAGNOSIS — S93492A Sprain of other ligament of left ankle, initial encounter: Secondary | ICD-10-CM

## 2023-10-06 MED ORDER — IBUPROFEN 800 MG PO TABS
800.0000 mg | ORAL_TABLET | Freq: Once | ORAL | Status: AC
  Administered 2023-10-06: 800 mg via ORAL

## 2023-10-06 NOTE — ED Triage Notes (Addendum)
 Pt presents with complaints of left ankle injury following a game of kickball last night. Pt states he did not initially hear a "pop" however the ankle/foot went numb when he bent down to get the ball. This morning, pt was in excruciating pain when bearing weight on left extremity. Pt currently rates his overall pain a 7/10. Denies taking medications for pain PTA. Ice applied.

## 2023-10-06 NOTE — ED Provider Notes (Signed)
 [facility]    CSN: [id number] Arrival date & time: 10/06/23  9562      History   Chief Complaint Chief Complaint  Patient presents with   Ankle Injury    HPI [name] is a 33 y.o. male.   HPI  He reports left ankle pain since last night Reports injury last night at around 9 PM  He reports concerns for "fluid in there" and denies notable swelling Reports he was playing kickball last night and dropped down to miss the ball. He is not sure how he landed on his leg or ankle and reports immediate numbness to the ankle after this  He reports shortly after that he was able to bear limited weight and played the rest of the game with limping gait He has not taken any medications for this He did put ice on it this AM      Past Medical History:  Diagnosis Date   ACL (anterior cruciate ligament) tear    RIGHT KNEE   Medical history non-contributory    Right knee meniscal tear     Patient Active Problem List   Diagnosis Date Noted   Right ACL tear 04/07/2016   S/P ACL surgery 05/16/2013   RHINITIS, ALLERGIC 07/29/2006    Past Surgical History:  Procedure Laterality Date   ANTERIOR CRUCIATE LIGAMENT REPAIR Right 07/16/2016   Procedure: RIGHT KNEE REVISION, RECONSTRUCTION ANTERIOR CRUCIATE LIGAMENT (ACL) WITH HAMSTRING GRAFT, BONE PATELLA BONE AUTOGRAFT, REMOVAL OF HARDWARE TIMES TWO;  Surgeon: Jasmine Mesi, MD;  Location: [facility];  Service: Orthopedics;  Laterality: Right;   KNEE ARTHROSCOPY WITH ANTERIOR CRUCIATE LIGAMENT (ACL) REPAIR Right 10-04-2012   KNEE ARTHROSCOPY WITH ANTERIOR CRUCIATE LIGAMENT (ACL) REPAIR Right 05/16/2013   Procedure: RIGHT KNEE ARTHROSCOPY DEBRIDEMENT PARTIAL MEDIAL  MENISCECTOMY AND ANTERIOR CRUCIATE LIGAMENT (ACL) RECONSTRUCTION ALLOGRAFT REVISION ;  Surgeon: Genevie Kerns, MD;  Location: [facility];  Service: Orthopedics;  Laterality: Right;       Home Medications    Prior to Admission medications    Medication Sig Start Date End Date Taking? Authorizing Provider  aspirin  EC 325 MG tablet Take 1 tablet (325 mg total) by mouth 2 (two) times daily. 05/16/13   Delona Ferron, PA-C    Family History History reviewed. No pertinent family history.  Social History Social History   Tobacco Use   Smoking status: Never   Smokeless tobacco: Never  Vaping Use   Vaping status: Every Day  Substance Use Topics   Alcohol use: Yes    Comment: social   Drug use: No     Allergies   Patient has no known allergies.   Review of Systems Review of Systems  Musculoskeletal:  Positive for arthralgias, joint swelling and myalgias.     Physical Exam Triage Vital Signs ED Triage Vitals  Encounter Vitals Group     BP 10/06/23 0904 125/89     Systolic BP Percentile --      Diastolic BP Percentile --      Pulse Rate 10/06/23 0904 97     Resp 10/06/23 0904 16     Temp 10/06/23 0904 98 F (36.7 C)     Temp Source 10/06/23 0904 Oral     SpO2 10/06/23 0904 97 %     Weight 10/06/23 0904 175 lb (79.4 kg)     Height 10/06/23 0904 5\' 9"  (1.753 m)     Head Circumference --      Peak Flow --  Pain Score 10/06/23 0912 7     Pain Loc --      Pain Education --      Exclude from Growth Chart --    No data found.  Updated Vital Signs BP 125/89 (BP Location: Right Arm)   Pulse 97   Temp 98 F (36.7 C) (Oral)   Resp 16   Ht 5\' 9"  (1.753 m)   Wt 175 lb (79.4 kg)   SpO2 97%   BMI 25.84 kg/m   Visual Acuity Right Eye Distance:   Left Eye Distance:   Bilateral Distance:    Right Eye Near:   Left Eye Near:    Bilateral Near:     Physical Exam Vitals reviewed.  Constitutional:      General: He is awake.     Appearance: Normal appearance. He is well-developed and well-groomed.  HENT:     Head: Normocephalic and atraumatic.  Pulmonary:     Effort: Pulmonary effort is normal.  Musculoskeletal:     Left ankle: Swelling present. No deformity, ecchymosis or lacerations.  Tenderness present over the medial malleolus. Normal range of motion. Anterior drawer test negative. Normal pulse.     Left Achilles Tendon: Normal. No tenderness or defects. Thompson's test negative.     Left foot: Normal range of motion and normal capillary refill. Tenderness present. No swelling, bony tenderness or crepitus. Normal pulse.       Feet:     Comments: Range of motion with regards to the left ankle: Patient has intact and symmetrical range of motion with regards to dorsiflexion, plantarflexion, supination, pronation.  He reports tenderness with supination along medial malleolus and medial aspect of the arch of the foot. Dorsalis pedis and posterior tibialis pulses are 2+ and brisk.  No signs of erythema, bruising, abnormalities or malformations.  Mild swelling noted along the lateral malleolus.  No evidence of instability with anterior drawer and negative Thompson's test on the left  Neurological:     Mental Status: He is alert.  Psychiatric:        Behavior: Behavior is cooperative.      UC Treatments / Results  Labs (all labs ordered are listed, but only abnormal results are displayed) Labs Reviewed - No data to display  EKG   Radiology DG Ankle Complete Left Result Date: 10/06/2023 CLINICAL DATA:  Left ankle pain after injury.  Heard a pop. EXAM: LEFT ANKLE COMPLETE - 3+ VIEW COMPARISON:  None Available. FINDINGS: There is no evidence of fracture or dislocation ankle mortise is preserved. Corticated density distal to the medial malleolus is chronic. Moderate ankle joint effusion. Soft tissues are unremarkable. IMPRESSION: Moderate ankle joint effusion. No acute fracture or dislocation. Electronically Signed   By: Chadwick Colonel M.D.   On: 10/06/2023 09:55    Procedures Procedures (including critical care time)  Medications Ordered in UC Medications  ibuprofen (ADVIL) tablet 800 mg (800 mg Oral Given 10/06/23 1011)    Initial Impression / Assessment and Plan / UC  Course  I have reviewed the triage vital signs and the nursing notes.  Pertinent labs & imaging results that were available during my care of the patient were reviewed by me and considered in my medical decision making (see chart for details).      Final Clinical Impressions(s) / UC Diagnoses   Final diagnoses:  Sprain of other ligament of left ankle, initial encounter   Patient presents today with concerns for left ankle pain and swelling following potential  injury last night.  He reports pain with weightbearing and supination particularly along the medial aspect of the foot and medial malleolus.  There is mild swelling along the lateral malleolus compared to the right.  Range of motion appears intact and no notable instability is appreciated.  Radiology interpretation of left ankle x-ray did not show signs of acute fracture or dislocation but did note moderate ankle joint effusion.  Will provide lace up ankle brace to provide stability as well as ibuprofen to assist with inflammation.  Recommend gentle stretches and rehab per patient education instructions included in after visit summary.  Recommend Tylenol , ibuprofen, warm compresses as desired for further symptomatic relief.  If symptoms are not improving or seem to be worsening recommend follow-up with orthopedics.  Orthopedic contact information provided in after visit summary.  Follow-up as needed    Discharge Instructions      You are seen today for concerns of left ankle pain and swelling.  At this time your imaging does not show sign of a fracture or dislocation but did show a moderate ankle joint effusion.  This is also known as fluid in the joint.  This is likely the cause for your pain and discomfort but I am suspicious that you may have an ankle sprain.  We have sent you home with an ankle brace to help provide stability and support. We have also provided you with ibuprofen to assist with pain and inflammation.  You can alternate  Tylenol  and ibuprofen as needed for pain and discomfort.  I recommend warm compresses to the area and elevating it when you are able to to help reduce swelling. I have included some stretches and rehab instructions in your paperwork.  Please follow these as tolerated to assist with recovery.  If your symptoms are not improving or seem to be getting worse over the next 2 to 4 weeks please follow-up here or go to the orthopedic urgent care for further evaluation and ongoing management.  [facility] [address], [address], [place], [place] [address] [phone]  [facility] [address], [place], [place] [address]  [phone]       ED Prescriptions   None    PDMP not reviewed this encounter.   Jerona Mooring, PA-C 10/06/23 1304

## 2023-10-06 NOTE — Discharge Instructions (Addendum)
 You are seen today for concerns of left ankle pain and swelling.  At this time your imaging does not show sign of a fracture or dislocation but did show a moderate ankle joint effusion.  This is also known as fluid in the joint.  This is likely the cause for your pain and discomfort but I am suspicious that you may have an ankle sprain.  We have sent you home with an ankle brace to help provide stability and support. We have also provided you with ibuprofen to assist with pain and inflammation.  You can alternate Tylenol  and ibuprofen as needed for pain and discomfort.  I recommend warm compresses to the area and elevating it when you are able to to help reduce swelling. I have included some stretches and rehab instructions in your paperwork.  Please follow these as tolerated to assist with recovery.  If your symptoms are not improving or seem to be getting worse over the next 2 to 4 weeks please follow-up here or go to the orthopedic urgent care for further evaluation and ongoing management.  [facility] [address], [address], [place], [place] [address] [phone]  [facility] [address] [place], [place] [address]  [phone]

## 2023-11-04 ENCOUNTER — Ambulatory Visit
Admission: RE | Admit: 2023-11-04 | Discharge: 2023-11-04 | Disposition: A | Discharge status: Home / Self Care | Source: Ambulatory Visit | Attending: Physician Assistant | Admitting: Physician Assistant

## 2023-11-04 ENCOUNTER — Other Ambulatory Visit: Payer: Self-pay

## 2023-11-04 VITALS — BP 134/83 | HR 66 | Temp 97.9°F | Resp 18 | Ht 69.0 in | Wt 165.0 lb

## 2023-11-04 DIAGNOSIS — M5441 Lumbago with sciatica, right side: Secondary | ICD-10-CM

## 2023-11-04 DIAGNOSIS — M533 Sacrococcygeal disorders, not elsewhere classified: Secondary | ICD-10-CM

## 2023-11-04 MED ORDER — MELOXICAM 7.5 MG PO TABS: 7.5000 mg | ORAL_TABLET | Freq: Every day | ORAL | 0 refills | Status: DC

## 2023-11-04 NOTE — Discharge Instructions (Addendum)
 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 You can try using over-the-counter pain reliever such as ibuprofen  or Tylenol  as needed for pain relief.    I have sent in a script for Meloxicam for you to take once per day. DO NOT use other NSAIDs while taking this medication (ibuprofen , motrin , aleve, advil , naproxen, etc)   I have included some stretches and further information about sciatica as well as SI joint dysfunction in your after visit summary.  Please review these and try to do some of the rehab and stretches that are included to assist with your symptom management.  I do recommend following up with orthopedics for further evaluation and ongoing management and especially to get established with physical therapy.  [facility] [address], [address], [place], [place] [address] [phone]  [facility] [address] [place], [place] [address]  [phone]

## 2023-11-04 NOTE — ED Provider Notes (Signed)
 [facility]    CSN: [id number] Arrival date & time: 11/04/23  1127      History   Chief Complaint Chief Complaint  Patient presents with   Back Pain    Entered by patient    HPI [name] is a 33 y.o. male.   HPI  Pt reports concerns for his back  He states he has had back pain since Jan following lifting the axle of a truck He reports hearing a popping sound in his back and experiencing some numbness and tightness immediately after He reports that since this incident he has the sensation that his back is "locked up"  He reports this lasts for a few seconds at a time and is usually elicited by sitting or standing for prolonged periods  He reports having back pain along the lower lumbar spine and sometimes shifts to the right and left He reports some shooting pains into the right leg. Reports this starts in the hip and radiates down to level of right knee   He denies saddle anesthesia, incontinence or fevers, chills    He has tried warm and cool compresses  He has tried warm soaks and warm baths but these are not providing lasting relief    Past Medical History:  Diagnosis Date   ACL (anterior cruciate ligament) tear    RIGHT KNEE   Medical history non-contributory    Right knee meniscal tear     Patient Active Problem List   Diagnosis Date Noted   Right ACL tear 04/07/2016   S/P ACL surgery 05/16/2013   RHINITIS, ALLERGIC 07/29/2006    Past Surgical History:  Procedure Laterality Date   ANTERIOR CRUCIATE LIGAMENT REPAIR Right 07/16/2016   Procedure: RIGHT KNEE REVISION, RECONSTRUCTION ANTERIOR CRUCIATE LIGAMENT (ACL) WITH HAMSTRING GRAFT, BONE PATELLA BONE AUTOGRAFT, REMOVAL OF HARDWARE TIMES TWO;  Surgeon: Jasmine Mesi, MD;  Location: [facility] OR;  Service: Orthopedics;  Laterality: Right;   KNEE ARTHROSCOPY WITH ANTERIOR CRUCIATE LIGAMENT (ACL) REPAIR Right 10-04-2012   KNEE ARTHROSCOPY WITH ANTERIOR CRUCIATE LIGAMENT (ACL) REPAIR Right  05/16/2013   Procedure: RIGHT KNEE ARTHROSCOPY DEBRIDEMENT PARTIAL MEDIAL  MENISCECTOMY AND ANTERIOR CRUCIATE LIGAMENT (ACL) RECONSTRUCTION ALLOGRAFT REVISION ;  Surgeon: Genevie Kerns, MD;  Location: [facility];  Service: Orthopedics;  Laterality: Right;       Home Medications    Prior to Admission medications   Medication Sig Start Date End Date Taking? Authorizing Provider  meloxicam (MOBIC) 7.5 MG tablet Take 1 tablet (7.5 mg total) by mouth daily. 11/04/23  Yes Gaylan Fauver E, PA-C  aspirin  EC 325 MG tablet Take 1 tablet (325 mg total) by mouth 2 (two) times daily. 05/16/13   Delona Ferron, PA-C    Family History History reviewed. No pertinent family history.  Social History Social History   Tobacco Use   Smoking status: Never   Smokeless tobacco: Never  Vaping Use   Vaping status: Every Day  Substance Use Topics   Alcohol use: Yes    Comment: social   Drug use: No     Allergies   Patient has no known allergies.   Review of Systems Review of Systems  Musculoskeletal:  Positive for back pain and myalgias.  Neurological:  Positive for numbness.     Physical Exam Triage Vital Signs ED Triage Vitals  Encounter Vitals Group     BP 11/04/23 1208 134/83     Systolic BP Percentile --      Diastolic BP  Percentile --      Pulse Rate 11/04/23 1208 66     Resp 11/04/23 1208 18     Temp 11/04/23 1208 97.9 F (36.6 C)     Temp Source 11/04/23 1208 Oral     SpO2 11/04/23 1208 97 %     Weight 11/04/23 1210 165 lb (74.8 kg)     Height 11/04/23 1210 5\' 9"  (1.753 m)     Head Circumference --      Peak Flow --      Pain Score 11/04/23 1210 3     Pain Loc --      Pain Education --      Exclude from Growth Chart --    No data found.  Updated Vital Signs BP 134/83 (BP Location: Right Arm)   Pulse 66   Temp 97.9 F (36.6 C) (Oral)   Resp 18   Ht 5\' 9"  (1.753 m)   Wt 165 lb (74.8 kg)   SpO2 97%   BMI 24.37 kg/m   Visual Acuity Right Eye  Distance:   Left Eye Distance:   Bilateral Distance:    Right Eye Near:   Left Eye Near:    Bilateral Near:     Physical Exam Vitals reviewed.  Constitutional:      General: He is awake. He is not in acute distress.    Appearance: Normal appearance. He is well-developed and well-groomed. He is not ill-appearing or toxic-appearing.  HENT:     Head: Normocephalic and atraumatic.  Eyes:     Extraocular Movements: Extraocular movements intact.     Conjunctiva/sclera: Conjunctivae normal.  Pulmonary:     Effort: Pulmonary effort is normal.  Musculoskeletal:     Cervical back: Normal range of motion.     Comments: No obvious step offs or abnormalities on palpation of cervical, thoracic, lumbar spines No significant tenderness to palpation with regards to bilateral SI joints or spine/ paraspinal areas   ROM findings Thoracic: Lateral flexion and lateral rotation are intact and symmetrical Lumbar: Extension, flexion are intact Hips: Extension, flexion, abduction, adduction are symmetrical and intact Knees: Knee range of motion is intact and symmetrical.  Flexion and extension are normal.     Neurological:     General: No focal deficit present.     Mental Status: He is alert and oriented to person, place, and time.     GCS: GCS eye subscore is 4. GCS verbal subscore is 5. GCS motor subscore is 6.  Psychiatric:        Attention and Perception: Attention normal.        Mood and Affect: Mood normal.        Speech: Speech normal.        Behavior: Behavior normal. Behavior is cooperative.      UC Treatments / Results  Labs (all labs ordered are listed, but only abnormal results are displayed) Labs Reviewed - No data to display  EKG   Radiology No results found.  Procedures Procedures (including critical care time)  Medications Ordered in UC Medications - No data to display  Initial Impression / Assessment and Plan / UC Course  I have reviewed the triage vital signs and  the nursing notes.  Pertinent labs & imaging results that were available during my care of the patient were reviewed by me and considered in my medical decision making (see chart for details).      Final Clinical Impressions(s) / UC Diagnoses   Final diagnoses:  SI (sacroiliac) joint dysfunction  Acute right-sided low back pain with right-sided sciatica   Patient presents today with concerns for lower back pain that is predominantly on the left side but it is intermittently moving over to the left.  He also reports some numbness and tingling sensations that radiate from the right hip to the level of the right knee.  He states this has been ongoing for about 6 months and there is mild relief with warm compresses, cool compresses, warm baths.  Physical exam is largely reassuring at this time and he does not endorse red flag symptoms during HPI.  At this time I suspect likely SI joint dysfunction with right sided sciatica.  Reviewed with patient that recovery can take some time especially with conservative measures.  Will send meloxicam 7.5 mg p.o. daily to assist with inflammation and discomfort.  Recommend continue warm compresses, gentle stretches and massage as tolerated to further assist with management.  Reviewed that he can follow-up with orthopedics for further evaluation and potential referral into physical therapy.  Follow-up as needed     Discharge Instructions      You were seen today for concerns of lower back pain with radiation into your right leg At this time I suspect your symptoms are likely due to SI joint dysfunction as well as some sciatica. You can try using over-the-counter pain reliever such as ibuprofen  or Tylenol  as needed for pain relief.    I have sent in a script for Meloxicam for you to take once per day. DO NOT use other NSAIDs while taking this medication (ibuprofen , motrin , aleve, advil , naproxen, etc)   I have included some stretches and further information  about sciatica as well as SI joint dysfunction in your after visit summary.  Please review these and try to do some of the rehab and stretches that are included to assist with your symptom management.  I do recommend following up with orthopedics for further evaluation and ongoing management and especially to get established with physical therapy.  [facility] [address], [place], [place] [address] [phone]  [facility] [address], [place], [place] [address]  [phone]     ED Prescriptions     Medication Sig Dispense Auth. Provider   meloxicam (MOBIC) 7.5 MG tablet Take 1 tablet (7.5 mg total) by mouth daily. 30 tablet Tyvion Edmondson E, PA-C      PDMP not reviewed this encounter.   Jerona Mooring, PA-C 11/04/23 7425

## 2023-11-04 NOTE — ED Triage Notes (Signed)
 Pt presents with complaints of lower back pain that is radiating into right leg (numbness feeling) x 6 months. Pt states back in January, he was working on a car and heard his back "pop." The pain has been intermittent since. Pt currently rates his overall pain a 3/10. OTC Ibuprofen  taken with no noticeable relief. Ice pack + heat applied as well. Pt voices concerns as he is starting [employer] soon.

## 2024-06-13 ENCOUNTER — Emergency Department (HOSPITAL_BASED_OUTPATIENT_CLINIC_OR_DEPARTMENT_OTHER): Admitting: Radiology

## 2024-06-13 ENCOUNTER — Emergency Department (HOSPITAL_BASED_OUTPATIENT_CLINIC_OR_DEPARTMENT_OTHER)
Admission: EM | Admit: 2024-06-13 | Discharge: 2024-06-13 | Disposition: A | Discharge status: Home / Self Care | Source: Ambulatory Visit | Attending: Emergency Medicine | Admitting: Emergency Medicine

## 2024-06-13 ENCOUNTER — Ambulatory Visit: Payer: Self-pay

## 2024-06-13 ENCOUNTER — Other Ambulatory Visit: Payer: Self-pay

## 2024-06-13 DIAGNOSIS — M79602 Pain in left arm: Secondary | ICD-10-CM | POA: Insufficient documentation

## 2024-06-13 DIAGNOSIS — Z7982 Long term (current) use of aspirin: Secondary | ICD-10-CM | POA: Insufficient documentation

## 2024-06-13 DIAGNOSIS — R0789 Other chest pain: Secondary | ICD-10-CM | POA: Insufficient documentation

## 2024-06-13 DIAGNOSIS — R079 Chest pain, unspecified: Secondary | ICD-10-CM

## 2024-06-13 LAB — BASIC METABOLIC PANEL WITH GFR
Anion gap: 12 (ref 5–15)
BUN: 10 mg/dL (ref 6–20)
CO2: 27 mmol/L (ref 22–32)
Calcium: 10.3 mg/dL (ref 8.9–10.3)
Chloride: 101 mmol/L (ref 98–111)
Creatinine, Ser: 0.82 mg/dL (ref 0.61–1.24)
GFR, Estimated: >60 mL/min
Glucose, Bld: 104 mg/dL — ABNORMAL HIGH (ref 70–99)
Potassium: 4 mmol/L (ref 3.5–5.1)
Sodium: 139 mmol/L (ref 135–145)

## 2024-06-13 LAB — CBC
HCT: 45.7 % (ref 39.0–52.0)
Hemoglobin: 15.6 g/dL (ref 13.0–17.0)
MCH: 29.7 pg (ref 26.0–34.0)
MCHC: 34.1 g/dL (ref 30.0–36.0)
MCV: 86.9 fL (ref 80.0–100.0)
Platelets: 235 K/uL (ref 150–400)
RBC: 5.26 MIL/uL (ref 4.22–5.81)
RDW: 13.1 % (ref 11.5–15.5)
WBC: 5.8 K/uL (ref 4.0–10.5)
nRBC: 0 % (ref 0.0–0.2)

## 2024-06-13 LAB — TROPONIN T, HIGH SENSITIVITY: Troponin T High Sensitivity: <15 ng/L (ref 0–19)

## 2024-06-13 MED ORDER — ALUM & MAG HYDROXIDE-SIMETH 200-200-20 MG/5ML PO SUSP
30.0000 mL | Freq: Once | ORAL | Status: AC
  Administered 2024-06-13: 30 mL via ORAL
  Filled 2024-06-13: qty 30

## 2024-06-13 MED ORDER — PANTOPRAZOLE SODIUM 20 MG PO TBEC: 40.0000 mg | DELAYED_RELEASE_TABLET | Freq: Every day | ORAL | 0 refills | Status: AC

## 2024-06-13 NOTE — ED Provider Notes (Signed)
 " [place] [facility] AT [facility] Provider Note   CSN: [id number] Arrival date & time: 06/13/24  9041     Patient presents with: Chest Pain   [name] is a 34 y.o. male.   HPI       Presents with concern for chest pain Heavy left side of chest and back, like someone sitting on the chest Also had sharp pain in the left arm, almost like squeezing like blood pressure cuff or restricted blood flow-that was yestereday Today feels more like a sensation that left arm is asleep, feels tingling, not constant is every now and then, pain and tingling both coming and going Chest has been heavy all day Started yesterday Has had trouble breathing for a while for months, didn't really pay attention to it Not worsening today No nausea or vomiting  Spitting up some mucus, not new, times will have nose bleed or dried up mucus with blood, has been going on for a long time, nose bleed No cough No fever No leg swelling Has had 3 acl surgeries, has pain around the right knee Tooth pulled, no recent surgeries in last few months No recent travel No hx of dvt/pe, ---mom had hx of pe, lung disease No known fam hx of early heart disease No smoking/alcohol or other drugs No recent hospitalizations  5-6/10 tightness, nothing makes it better or worse    Past Medical History:  Diagnosis Date   ACL (anterior cruciate ligament) tear    RIGHT KNEE   Medical history non-contributory    Right knee meniscal tear      Prior to Admission medications  Medication Sig Start Date End Date Taking? Authorizing Provider  pantoprazole  (PROTONIX ) 20 MG tablet Take 2 tablets (40 mg total) by mouth daily. 06/13/24 06/20/24 Yes Dreama Longs, MD  aspirin  EC 325 MG tablet Take 1 tablet (325 mg total) by mouth 2 (two) times daily. 05/16/13   Stilwell, Dorsey CROME, PA-C  meloxicam  (MOBIC ) 7.5 MG tablet Take 1 tablet (7.5 mg total) by mouth daily. 11/04/23   Mecum, Zoeya Gramajo E, PA-C    Allergies:  Patient has no known allergies.    Review of Systems  Updated Vital Signs BP 134/77   Pulse 88   Temp 98.6 F (37 C) (Oral)   Resp 12   SpO2 95%   Physical Exam Vitals and nursing note reviewed.  Constitutional:      General: He is not in acute distress.    Appearance: He is well-developed. He is not diaphoretic.  HENT:     Head: Normocephalic and atraumatic.  Eyes:     Conjunctiva/sclera: Conjunctivae normal.  Cardiovascular:     Rate and Rhythm: Normal rate and regular rhythm.     Heart sounds: Normal heart sounds. No murmur heard.    No friction rub. No gallop.  Pulmonary:     Effort: Pulmonary effort is normal. No respiratory distress.     Breath sounds: Normal breath sounds. No wheezing or rales.  Abdominal:     General: There is no distension.     Palpations: Abdomen is soft.     Tenderness: There is no abdominal tenderness. There is no guarding.  Musculoskeletal:     Cervical back: Normal range of motion.  Skin:    General: Skin is warm and dry.  Neurological:     Mental Status: He is alert and oriented to person, place, and time.     (all labs ordered are listed, but only  abnormal results are displayed) Labs Reviewed  BASIC METABOLIC PANEL WITH GFR - Abnormal; Notable for the following components:      Result Value   Glucose, Bld 104 (*)    All other components within normal limits  CBC  TROPONIN T, HIGH SENSITIVITY    EKG: EKG Interpretation Date/Time:  Tuesday June 13 2024 10:14:49 EST Ventricular Rate:  86 PR Interval:  126 QRS Duration:  89 QT Interval:  343 QTC Calculation: 411 R Axis:   48  Text Interpretation: Sinus rhythm Borderline repolarization abnormality Borderline ST elevation, anterior leads Baseline wander in lead(s) III aVF No previous ECGs available Confirmed by Dreama Longs ([id number]) on 06/13/2024 12:40:19 PM  Radiology: [facility] Chest 2 View Result Date: 06/13/2024 CLINICAL DATA:  Left chest pain radiating to left arm. EXAM:  DG CHEST 2V COMPARISON:  10/09/2019 FINDINGS: The heart size and mediastinal contours are within normal limits. Both lungs are clear. The visualized skeletal structures are unremarkable. IMPRESSION: No active cardiopulmonary disease. Electronically Signed   By: Toribio Agreste M.D.   On: 06/13/2024 10:51     Procedures   Medications Ordered in the ED  alum & mag hydroxide-simeth (MAALOX/MYLANTA) 200-200-20 MG/5ML suspension 30 mL (30 mLs Oral Given 06/13/24 1255)                                     34 year old male with no significant medical history presents with concern of chest pain.   Differential diagnosis for chest pain includes pulmonary embolus, dissection, pneumothorax, pneumonia, ACS, myocarditis, pericarditis.    EKG was done and evaluate by me and showed no signs of pericarditis, TW abnormality without prior ECG for comparison.   Chest x-ray was done and evaluated by me and radiology and showed no sign of pneumonia or pneumothorax.   Labs evaluated by me without clinically significant anemia, electrolyte abnormalities. Patient is PERC negative and low risk Wells and have low suspicion for PE.  Low clinical suspicion for aortic dissection. Has intermittent tingling in his left arm with symptoms, normal pulses, doubt acute arterial thrombosis, hx not consistent with intracranial etiology.  Patient is low risk HEART score and has had symptoms for greater than 6 hours with normal troponin and have low suspicion for ACS.   Will refer to Cardiology given symptoms, as well as episodes of palpitations for consideration of further work up .  Will trial PPI. Patient discharged in stable condition with understanding of reasons to return.       Final diagnoses:  Chest pain, unspecified type    ED Discharge Orders          Ordered    Ambulatory referral to Cardiology        06/13/24 1250    pantoprazole  (PROTONIX ) 20 MG tablet  Daily        06/13/24 1250                Dreama Longs, MD 06/13/24 2201  "

## 2024-06-13 NOTE — ED Triage Notes (Signed)
 Reports left sided CP that rads down into left arm. Reports tingling down to fingers.

## 2024-06-13 NOTE — Telephone Encounter (Signed)
 Has no PCP, sent to ED for CP  FYI Only or Action Required?: FYI only for provider: ED advised.  Called Nurse Triage reporting Chest Pain.  Symptoms began several days ago.  Interventions attempted: Nothing.  Symptoms are: rapidly worsening.  Triage Disposition: Go to ED Now (Notify PCP)  Patient/caregiver understands and will follow disposition?:    Copied from CRM [id number]. Topic: Clinical - Red Word Triage >> Jun 13, 2024  8:40 AM Viola FALCON wrote: Red Word that prompted transfer to Nurse Triage: New patient wanting to schedule at [facility] Med office - having numbness in left arm/pressure on left side of chest, spits up blood/mucus and nose bleeds Reason for Disposition  [1] Chest pain (or angina) comes and goes AND [2] is happening more often (increasing in frequency) or getting worse (increasing in severity)  (Exception: Chest pains that last only a few seconds.)  Answer Assessment - Initial Assessment Questions 1. LOCATION: Where does it hurt?       Starts in L pec, wraps around his upper left scapula  2. RADIATION: Does the pain go anywhere else? (e.g., into neck, jaw, arms, back)     Reports numbness that shoots down his left arm  3. ONSET: When did the chest pain begin? (Minutes, hours or days)      Months, but has been continuous since Sunday  Protocols used: Chest Pain-A-AH

## 2024-06-19 ENCOUNTER — Encounter (HOSPITAL_BASED_OUTPATIENT_CLINIC_OR_DEPARTMENT_OTHER): Payer: Self-pay | Admitting: *Deleted

## 2024-06-19 ENCOUNTER — Other Ambulatory Visit: Payer: Self-pay

## 2024-06-19 ENCOUNTER — Emergency Department (HOSPITAL_BASED_OUTPATIENT_CLINIC_OR_DEPARTMENT_OTHER)
Admission: EM | Admit: 2024-06-19 | Discharge: 2024-06-19 | Disposition: A | Discharge status: Home / Self Care | Attending: Emergency Medicine | Admitting: Emergency Medicine

## 2024-06-19 ENCOUNTER — Emergency Department (HOSPITAL_BASED_OUTPATIENT_CLINIC_OR_DEPARTMENT_OTHER): Admitting: Radiology

## 2024-06-19 DIAGNOSIS — I10 Essential (primary) hypertension: Secondary | ICD-10-CM | POA: Diagnosis not present

## 2024-06-19 DIAGNOSIS — Z7982 Long term (current) use of aspirin: Secondary | ICD-10-CM | POA: Insufficient documentation

## 2024-06-19 DIAGNOSIS — R079 Chest pain, unspecified: Secondary | ICD-10-CM

## 2024-06-19 DIAGNOSIS — F419 Anxiety disorder, unspecified: Secondary | ICD-10-CM | POA: Diagnosis not present

## 2024-06-19 DIAGNOSIS — R42 Dizziness and giddiness: Secondary | ICD-10-CM | POA: Diagnosis not present

## 2024-06-19 DIAGNOSIS — R0789 Other chest pain: Secondary | ICD-10-CM | POA: Diagnosis present

## 2024-06-19 LAB — BASIC METABOLIC PANEL WITH GFR
Anion gap: 13 (ref 5–15)
BUN: 11 mg/dL (ref 6–20)
CO2: 25 mmol/L (ref 22–32)
Calcium: 10.4 mg/dL — ABNORMAL HIGH (ref 8.9–10.3)
Chloride: 100 mmol/L (ref 98–111)
Creatinine, Ser: 0.88 mg/dL (ref 0.61–1.24)
GFR, Estimated: >60 mL/min
Glucose, Bld: 154 mg/dL — ABNORMAL HIGH (ref 70–99)
Potassium: 3.7 mmol/L (ref 3.5–5.1)
Sodium: 138 mmol/L (ref 135–145)

## 2024-06-19 LAB — CBC
HCT: 46.3 % (ref 39.0–52.0)
Hemoglobin: 15.9 g/dL (ref 13.0–17.0)
MCH: 29.2 pg (ref 26.0–34.0)
MCHC: 34.3 g/dL (ref 30.0–36.0)
MCV: 85.1 fL (ref 80.0–100.0)
Platelets: 225 K/uL (ref 150–400)
RBC: 5.44 MIL/uL (ref 4.22–5.81)
RDW: 13 % (ref 11.5–15.5)
WBC: 5.5 K/uL (ref 4.0–10.5)
nRBC: 0 % (ref 0.0–0.2)

## 2024-06-19 LAB — TROPONIN T, HIGH SENSITIVITY
Troponin T High Sensitivity: <15 ng/L (ref 0–19)
Troponin T High Sensitivity: <15 ng/L (ref 0–19)

## 2024-06-19 LAB — CBG MONITORING, ED: Glucose-Capillary: 80 mg/dL (ref 70–99)

## 2024-06-19 NOTE — Discharge Instructions (Addendum)
 Your workup here was negative today.  Please follow-up with cardiology as scheduled tomorrow.  Keep a log of blood pressure at home.  Eat a healthy diet.  Keep yourself hydrated.  Also follow-up with primary care doctor for further evaluation.  Return for worsening chest pain, shortness of breath, weakness, confusion.

## 2024-06-19 NOTE — ED Triage Notes (Signed)
 Pt was seen last week for CP and was htn and told to follow up with cardiology.  Pt states that he continues to have CP.  Pt appears anxious and tells me that he noted a bruise on his left forearm and became very concerned.  This appears to be old and likely related to blood draw during his last ER visit.

## 2024-06-19 NOTE — ED Provider Notes (Signed)
 " [place] EMERGENCY DEPARTMENT AT [facility] Provider Note   CSN: [id number] Arrival date & time: 06/19/24  1616     Patient presents with: Chest Pain and Anxiety   [name] is a 34 y.o. male.  presents to the ED for left-sided chest pain that started a week ago.  He states that it radiates down to his left arm and has numbness in that arm.  Patient was seen here on 06/13/2024 for similar symptoms where his workup was benign and was referred to cardiology.  He has an appointment with cardiology tomorrow.  Patient notes that he has been having episodes where his chest feels heavy and he feels off balance.  Denies room spinning sensation.  Denies any injuries or falls.  Denies recent long travel.  Denies recent surgery.  Denies leg swelling.  No history of blood clots.  Not on blood thinners.  Does not take any medication daily.  No family history of heart disease.  Denies alcohol or drug use.  Denies fevers, cough, chills, shortness of breath, diaphoresis, palpitations, nausea, abdominal pain, diarrhea, vomiting, weakness, headache, back pain, or any other symptoms at this time.      Chest Pain Associated symptoms: anxiety and dizziness   Anxiety Associated symptoms include chest pain.       Prior to Admission medications  Medication Sig Start Date End Date Taking? Authorizing Provider  aspirin  EC 325 MG tablet Take 1 tablet (325 mg total) by mouth 2 (two) times daily. 05/16/13   Stilwell, Bryson L, PA-C  meloxicam  (MOBIC ) 7.5 MG tablet Take 1 tablet (7.5 mg total) by mouth daily. 11/04/23   Mecum, Erin E, PA-C  pantoprazole  (PROTONIX ) 20 MG tablet Take 2 tablets (40 mg total) by mouth daily. 06/13/24 06/20/24  Dreama Longs, MD    Allergies: Patient has no known allergies.    Review of Systems  Cardiovascular:  Positive for chest pain.  Neurological:  Positive for dizziness.    Updated Vital Signs BP 124/78   Pulse 82   Temp 98.1 F (36.7 C) (Oral)   Resp 14    SpO2 99%   Physical Exam Vitals and nursing note reviewed.  Constitutional:      General: He is not in acute distress.    Appearance: He is well-developed. He is not ill-appearing, toxic-appearing or diaphoretic.  HENT:     Head: Normocephalic and atraumatic.  Eyes:     Extraocular Movements: Extraocular movements intact.     Conjunctiva/sclera: Conjunctivae normal.     Pupils: Pupils are equal, round, and reactive to light.  Cardiovascular:     Rate and Rhythm: Normal rate and regular rhythm.     Heart sounds: Normal heart sounds. No murmur heard. Pulmonary:     Effort: Pulmonary effort is normal. No respiratory distress.     Breath sounds: Normal breath sounds. No decreased breath sounds, wheezing, rhonchi or rales.  Chest:     Comments: Chest wall nontender to palpation Abdominal:     Palpations: Abdomen is soft.     Tenderness: There is no abdominal tenderness.  Musculoskeletal:        General: No swelling.     Cervical back: Neck supple.     Comments: Normal range of motion of all extremities  Skin:    General: Skin is warm and dry.     Capillary Refill: Capillary refill takes less than 2 seconds.     Findings: No rash.  Neurological:     Mental  Status: He is alert and oriented to person, place, and time.     Deep Tendon Reflexes: Reflexes are normal and symmetric.     Comments: Mental Status:  Alert, oriented, thought content appropriate, able to give a coherent history. Speech fluent without evidence of aphasia. Able to follow 2 step commands without difficulty.  Cranial Nerves:  II:  Peripheral visual fields grossly normal, pupils equal, round, reactive to light III,IV, VI: ptosis not present, extra-ocular motions intact bilaterally  V,VII: smile symmetric, facial light touch sensation equal VIII: hearing grossly normal to voice  X: uvula elevates symmetrically  XI: bilateral shoulder shrug symmetric and strong XII: midline tongue extension without  fassiculations Motor:  Normal tone. 5/5 in upper and lower extremities bilaterally including strong and equal grip strength and dorsiflexion/plantar flexion Sensory: Pinprick and light touch normal in all extremities.  Deep Tendon Reflexes: 2+ and symmetric in the biceps and patella Cerebellar: normal finger-to-nose with bilateral upper extremities Gait: normal gait and balance CV: distal pulses palpable throughout  2+ pulses bilaterally   Psychiatric:        Mood and Affect: Mood normal.     (all labs ordered are listed, but only abnormal results are displayed) Labs Reviewed  BASIC METABOLIC PANEL WITH GFR - Abnormal; Notable for the following components:      Result Value   Glucose, Bld 154 (*)    Calcium 10.4 (*)    All other components within normal limits  CBC  CBG MONITORING, ED  TROPONIN T, HIGH SENSITIVITY  TROPONIN T, HIGH SENSITIVITY    EKG: EKG Interpretation Date/Time:  Monday June 19 2024 16:34:50 EST Ventricular Rate:  78 PR Interval:  137 QRS Duration:  103 QT Interval:  363 QTC Calculation: 414 R Axis:   54  Text Interpretation: Sinus rhythm ST elev, probable normal early repol pattern Baseline wander in lead(s) V1 V2 V3 V5 No significant change since last tracing Confirmed by Randol Simmonds [phone]) on 06/19/2024 4:38:25 PM  Radiology: [facility] Chest 2 View Result Date: 06/19/2024 EXAM: 2 VIEW(S) XRAY OF THE CHEST 06/19/2024 05:13:55 PM COMPARISON: 06/13/2024. CLINICAL HISTORY: cp cp cp FINDINGS: LUNGS AND PLEURA: No focal pulmonary opacity. No pleural effusion. No pneumothorax. HEART AND MEDIASTINUM: No acute abnormality of the cardiac and mediastinal silhouettes. BONES AND SOFT TISSUES: No acute osseous abnormality. IMPRESSION: 1. No acute cardiopulmonary pathology. Electronically signed by: Franky Crease MD 06/19/2024 05:29 PM EST RP Workstation: [id number]     Procedures   Medications Ordered in the ED - No data to display                                   Medical Decision Making Amount and/or Complexity of Data Reviewed Labs: ordered. Radiology: ordered.   This patient presents to the ED for concern of left-sided chest pain. this involves an extensive number of treatment options, and is a complaint that carries with it a high risk of complications and morbidity.  The differential diagnosis includes ACS, pericarditis, myocarditis, aortic dissection, PE, pneumothorax, esophageal rupture, pneumonia, reflux/PUD, biliary disease, pancreatitis, costochondritis, anxiety.  Low suspicious for ACS.    Additional history obtained:  Additional history obtained from EMR External records from outside source obtained and reviewed including ED note from 06/13/2024 where he was seen for similar symptoms and referred to cardiology.   Lab Tests:  I Ordered, and personally interpreted labs.  The pertinent results include: No signs  of anemia, leukocytosis.  Negative CBC and CMP.  Negative troponin.   Imaging Studies ordered:  I ordered imaging studies including chest x-ray I independently visualized and interpreted imaging which showed no signs of pneumonia or other lung abnormality.  I agree with the radiologist interpretation   Cardiac Monitoring: / EKG:  The patient was maintained on a cardiac monitor.  I personally viewed and interpreted the cardiac monitored which showed an underlying rhythm of: Normal sinus   Dispostion: Patient is a healthy 34 year old male presenting today with left-sided chest pain that started a week ago.  He was seen here on 06/13/2024 for similar symptoms and referred to cardiology.  Patient has has an appointment with cardiology tomorrow.  Patient notes her symptoms have been more constant.  His chest feels heavy and he feels off balance.  On exam he is alert and oriented with no signs of respiratory distress.  Patient did appear anxious about his symptoms.  Mildly hypertensive upon initial evaluation but otherwise vital  signs stable.  Heart and lung normal exam.  Chest wall nontender to palpation.  Abdomen soft and nontender.  Neurovascularly intact.  No signs of dehydration.   Low clinical suspicion for aortic dissection and PE as patient is PERC negative with low risk Wells score.  BMP and CBC unremarkable with negative troponin.  Chest x-ray with no signs of pneumonia or pneumothorax.  EKG unremarkable.  Discussed these findings with the patient.  Reassured the patient and recommended him to follow-up with cardiology tomorrow as scheduled.  Advised patient to record blood pressure at home.  Also recommended patient to follow-up primary care doctor for further evaluation.  Return precautions provided.  Repeat vitals stable.  Tolerating p.o.  Patient is in agreement with plan and is stable for discharge.        Final diagnoses:  Chest pain, unspecified type    ED Discharge Orders     None          Braxton Dubois, PA-C 06/19/24 2051  "

## 2024-06-20 ENCOUNTER — Ambulatory Visit: Attending: Cardiovascular Disease | Admitting: Cardiovascular Disease

## 2024-06-20 ENCOUNTER — Encounter: Payer: Self-pay | Admitting: Cardiovascular Disease

## 2024-06-20 VITALS — BP 122/62 | HR 81 | Ht 70.0 in | Wt 184.2 lb

## 2024-06-20 DIAGNOSIS — R0789 Other chest pain: Secondary | ICD-10-CM | POA: Diagnosis not present

## 2024-06-20 NOTE — Progress Notes (Signed)
 "     06/20/2024 [name]   12-24-1990  [id number]  Primary Physician Patient, No Pcp Per Primary Cardiologist: David JINNY Lesches MD [name]  HPI:  [name] is a 34 y.o. 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  He was referred by the emergency room where he presented on 06/13/2024 for atypical chest and arm pain.  He has no cardiac risk factors.  He stopped smoking 8 years ago.  He is fairly active and works out at [employer] and does [employer].  He has had 3 ACL surgeries.  He was working on a ladder several days before he presented to the ER taking down Christmas lights.  He developed symptoms fairly sudden onset left upper chest and arm pain and numbness.  He was evaluated in the emergency room where his EKG showed no acute changes.  Troponins were negative.  He still has numbness in his ulnar distribution on the left side.   Active Medications[1]   Allergies[2]  Social History   Socioeconomic History   Marital status: Single    Spouse name: Not on file   Number of children: Not on file   Years of education: Not on file   Highest education level: Not on file  Occupational History   Not on file  Tobacco Use   Smoking status: Every Day    Types: Cigarettes   Smokeless tobacco: Never   Tobacco comments:    06/20/2024 Patient vape daily  Vaping Use   Vaping status: Every Day  Substance and Sexual Activity   Alcohol use: Yes    Comment: social   Drug use: No   Sexual activity: Not on file  Other Topics Concern   Not on file  Social History Narrative   Not on file   Social Drivers of Health   Tobacco Use: High Risk (06/20/2024)   Patient History    Smoking Tobacco Use: Every Day    Smokeless Tobacco Use: Never    Passive Exposure: Not on file  Financial Resource Strain: Not on file  Food Insecurity: Not on file  Transportation Needs: Not on file  Physical Activity: Not on  file  Stress: Not on file  Social Connections: Unknown (10/13/2021)   Received from [facility]   Social Network    Social Network: Not on file  Intimate Partner Violence: Unknown (09/04/2021)   Received from [facility]   HITS    Physically Hurt: Not on file    Insult or Talk Down To: Not on file    Threaten Physical Harm: Not on file    Scream or Curse: Not on file  Depression (EYV7-0): Not on file  Alcohol Screen: Not on file  Housing: Not on file  Utilities: Not on file  Health Literacy: Not on file     Review of Systems: General: negative for chills, fever, night sweats or weight changes.  Cardiovascular: negative for chest pain, dyspnea on exertion, edema, orthopnea, palpitations, paroxysmal nocturnal dyspnea or shortness of breath Dermatological: negative for rash Respiratory: negative for cough or wheezing Urologic: negative for hematuria Abdominal: negative for nausea, vomiting, diarrhea, bright red blood per rectum, melena, or hematemesis Neurologic: negative for visual changes, syncope, or dizziness All other systems reviewed and are otherwise negative except as noted above.    Blood pressure 122/62, pulse 81, height 5' 10 (1.778 m), weight 184 lb 3.2 oz (83.6  kg), SpO2 99%.  General appearance: alert and no distress Neck: no adenopathy, no carotid bruit, no JVD, supple, symmetrical, trachea midline, and thyroid not enlarged, symmetric, no tenderness/mass/nodules Lungs: clear to auscultation bilaterally Heart: regular rate and rhythm, S1, S2 normal, no murmur, click, rub or gallop Extremities: extremities normal, atraumatic, no cyanosis or edema Pulses: 2+ and symmetric Skin: Skin color, texture, turgor normal. No rashes or lesions Neurologic: Grossly normal  EKG not performed today      ASSESSMENT AND PLAN:   Atypical chest pain [name] was seen in the ER 1 week ago of atypical chest pain.  He has no cardiac risk factors.  The pain began several days  after climbing up a ladder to take Christmas lights down.  It was characterized by pain in his left upper chest rating down his left arm.  He still has numbness in his ulnar distribution.  Workup was unrevealing including normal EKG and troponins.  I do not think his pain is cardiovascular nature.  However, I am going to get a 2D echo and a coronary calcium score to restratify.     David DOROTHA Lesches MD FACP,FACC,FAHA, FSCAI 06/20/2024 8:49 AM    [1]  Current Meds  Medication Sig   pantoprazole  (PROTONIX ) 20 MG tablet Take 2 tablets (40 mg total) by mouth daily.  [2] No Known Allergies  "

## 2024-06-20 NOTE — Assessment & Plan Note (Signed)
 [name] was seen in the ER 1 week ago of atypical chest pain.  He has no cardiac risk factors.  The pain began several days after climbing up a ladder to take Christmas lights down.  It was characterized by pain in his left upper chest rating down his left arm.  He still has numbness in his ulnar distribution.  Workup was unrevealing including normal EKG and troponins.  I do not think his pain is cardiovascular nature.  However, I am going to get a 2D echo and a coronary calcium score to restratify.

## 2024-06-20 NOTE — Patient Instructions (Signed)
 Medication Instructions:  Your physician recommends that you continue on your current medications as directed. Please refer to the Current Medication list given to you today.  *If you need a refill on your cardiac medications before your next appointment, please call your pharmacy*  Lab Work: Today- Lipids & LP(a)   If you have labs (blood work) drawn today and your tests are completely normal, you will receive your results only by: MyChart Message (if you have MyChart) OR A paper copy in the mail If you have any lab test that is abnormal or we need to change your treatment, we will call you to review the results.  Testing/Procedures: Your physician has requested that you have an echocardiogram. Echocardiography is a painless test that uses sound waves to create images of your heart. It provides your doctor with information about the size and shape of your heart and how well your hearts chambers and valves are working. This procedure takes approximately one hour. There are no restrictions for this procedure. Please do NOT wear cologne, perfume, aftershave, or lotions (deodorant is allowed). Please arrive 15 minutes prior to your appointment time.  Please note: We ask at that you not bring children with you during ultrasound (echo/ vascular) testing. Due to room size and safety concerns, children are not allowed in the ultrasound rooms during exams. Our front office staff cannot provide observation of children in our lobby area while testing is being conducted. An adult accompanying a patient to their appointment will only be allowed in the ultrasound room at the discretion of the ultrasound technician under special circumstances. We apologize for any inconvenience.   Dr. Court has ordered a CT coronary calcium score.   Test locations:  [facility] at [facility] [place] [facility] [place]  [place]  [facility]  This is $99 out of pocket.   Coronary CalciumScan A coronary calcium scan is an imaging test used to look for deposits of calcium and other fatty materials (plaques) in the inner lining of the blood vessels of the heart (coronary arteries). These deposits of calcium and plaques can partly clog and narrow the coronary arteries without producing any symptoms or warning signs. This puts a person at risk for a heart attack. This test can detect these deposits before symptoms develop. Tell a health care provider about: Any allergies you have. All medicines you are taking, including vitamins, herbs, eye drops, creams, and over-the-counter medicines. Any problems you or family members have had with anesthetic medicines. Any blood disorders you have. Any surgeries you have had. Any medical conditions you have. Whether you are pregnant or may be pregnant. What are the risks? Generally, this is a safe procedure. However, problems may occur, including: Harm to a pregnant woman and her unborn baby. This test involves the use of radiation. Radiation exposure can be dangerous to a pregnant woman and her unborn baby. If you are pregnant, you generally should not have this procedure done. Slight increase in the risk of cancer. This is because of the radiation involved in the test. What happens before the procedure? No preparation is needed for this procedure. What happens during the procedure? You will undress and remove any jewelry around your neck or chest. You will put on a hospital gown. Sticky electrodes will be placed on your chest. The electrodes will be connected to an electrocardiogram (ECG) machine to record a tracing of the electrical activity of your heart. A  CT scanner will take pictures of your heart. During this time, you will be asked to lie still and hold your breath for 2-3 seconds while a picture of your heart is being taken. The procedure may vary among health care providers  and hospitals. What happens after the procedure? You can get dressed. You can return to your normal activities. It is up to you to get the results of your test. Ask your health care provider, or the department that is doing the test, when your results will be ready. Summary A coronary calcium scan is an imaging test used to look for deposits of calcium and other fatty materials (plaques) in the inner lining of the blood vessels of the heart (coronary arteries). Generally, this is a safe procedure. Tell your health care provider if you are pregnant or may be pregnant. No preparation is needed for this procedure. A CT scanner will take pictures of your heart. You can return to your normal activities after the scan is done. This information is not intended to replace advice given to you by your health care provider. Make sure you discuss any questions you have with your health care provider. Document Released: 11/14/2007 Document Revised: 04/06/2016 Document Reviewed: 04/06/2016 Elsevier Interactive Patient Education  2017 [employer].   Follow-Up: At [facility], you and your health needs are our priority.  As part of our continuing mission to provide you with exceptional heart care, our providers are all part of one team.  This team includes your primary Cardiologist (physician) and Advanced Practice Providers or APPs (Physician Assistants and Nurse Practitioners) who all work together to provide you with the care you need, when you need it.  Your next appointment:   We will see you on an as needed basis.   Provider:   Dorn Lesches, MD    We recommend signing up for the patient portal called MyChart.  Sign up information is provided on this After Visit Summary.  MyChart is used to connect with patients for Virtual Visits (Telemedicine).  Patients are able to view lab/test results, encounter notes, upcoming appointments, etc.  Non-urgent messages can be sent to your provider  as well.   To learn more about what you can do with MyChart, go to [email or web address].   Other Instructions

## 2024-06-21 ENCOUNTER — Ambulatory Visit: Admitting: Family Medicine

## 2024-06-21 ENCOUNTER — Encounter: Payer: Self-pay | Admitting: Family Medicine

## 2024-06-21 ENCOUNTER — Ambulatory Visit: Payer: Self-pay | Admitting: Cardiovascular Disease

## 2024-06-21 VITALS — BP 140/84 | HR 85 | Temp 98.3°F | Ht 70.0 in | Wt 184.2 lb

## 2024-06-21 DIAGNOSIS — H539 Unspecified visual disturbance: Secondary | ICD-10-CM | POA: Diagnosis not present

## 2024-06-21 DIAGNOSIS — Z0000 Encounter for general adult medical examination without abnormal findings: Secondary | ICD-10-CM | POA: Insufficient documentation

## 2024-06-21 DIAGNOSIS — R0789 Other chest pain: Secondary | ICD-10-CM | POA: Diagnosis not present

## 2024-06-21 DIAGNOSIS — R200 Anesthesia of skin: Secondary | ICD-10-CM | POA: Insufficient documentation

## 2024-06-21 DIAGNOSIS — Z1329 Encounter for screening for other suspected endocrine disorder: Secondary | ICD-10-CM | POA: Diagnosis not present

## 2024-06-21 DIAGNOSIS — R202 Paresthesia of skin: Secondary | ICD-10-CM

## 2024-06-21 DIAGNOSIS — Z0001 Encounter for general adult medical examination with abnormal findings: Secondary | ICD-10-CM

## 2024-06-21 DIAGNOSIS — Z131 Encounter for screening for diabetes mellitus: Secondary | ICD-10-CM

## 2024-06-21 DIAGNOSIS — Z1321 Encounter for screening for nutritional disorder: Secondary | ICD-10-CM

## 2024-06-21 DIAGNOSIS — R030 Elevated blood-pressure reading, without diagnosis of hypertension: Secondary | ICD-10-CM | POA: Insufficient documentation

## 2024-06-21 LAB — LIPID PANEL
Chol/HDL Ratio: 4.1 ratio (ref 0.0–5.0)
Cholesterol, Total: 187 mg/dL (ref 100–199)
HDL: 46 mg/dL
LDL Chol Calc (NIH): 118 mg/dL — ABNORMAL HIGH (ref 0–99)
Triglycerides: 130 mg/dL (ref 0–149)
VLDL Cholesterol Cal: 23 mg/dL (ref 5–40)

## 2024-06-21 LAB — LIPOPROTEIN A (LPA): Lipoprotein (a): 11.3 nmol/L

## 2024-06-21 NOTE — Patient Instructions (Signed)
 It was great to meet you today and I'm excited to have you join the [facility] practice. I hope you had a positive experience today! If you feel so inclined, please feel free to recommend our practice to friends and family. Jeoffrey Barrio, FNP-C

## 2024-06-21 NOTE — Progress Notes (Addendum)
 "  New Patient Office Visit  Patient ID: [name], Male   DOB: 07-Dec-1990 33 y.o. MRN: [id number]  Chief Complaint  Patient presents with   Establish Care    Prompted by cardiology for a neurology for chest pain and arm numbness with faint feeling.    Subjective:     [name] presents to establish care. Oriented to practice routines and expectations. Has not been seeing PCP regularly. PMH includes recent chest pain for which he did see cardiology for. Workup negative. Cardiology recommended Neurology referral.   HPI  Discussed the use of AI scribe software for clinical note transcription with the patient, who gave verbal consent to proceed.  History of Present Illness [name] is a 34 year old male who presents with persistent chest tightness and left arm numbness.  He has been experiencing chest tightness since last Sunday, initially attributing it to muscle soreness from physical activity, such as removing Christmas lights. The tightness persisted into Monday and was accompanied by numbness in his left arm on Tuesday morning, described as a tingling sensation that did not resolve with movement or rubbing.  He sought emergency care where blood work and an EKG were performed, both of which returned normal results. Despite reassurance from the emergency department, his symptoms persisted. He returned to the hospital the following Monday, where further tests again showed no abnormalities. He subsequently consulted a cardiologist who also found no issues with his heart. Despite these evaluations, he continues to experience intermittent numbness in his left arm, particularly down the forearm to the elbow, and occasional sensations in his right knee, described as a 'weird sensation'.  He also reports experiencing blurred vision, ringing in his left ear, and headaches since the onset of these symptoms. The blurred vision is described as a reduction in awareness and focus, occurring daily.  He has a history of migraines since childhood but notes that recent headaches are not as severe.  He mentions a history of lower back pain following an incident in January where he lifted a heavy object, resulting in a year-long period of back pain that has since resolved. He has not been diagnosed with any chronic conditions such as hypertension, cholesterol issues, or diabetes, and has no significant past medical history aside from knee surgeries and a childhood skull fracture.  His family history is notable for his mother's drug addiction and lack of medical care. He is unaware of any other family medical history due to limited contact with his father.  Socially, he follows a diet high in red meat and previously used creatine and testosterone boosters, which he has since discontinued. He denies alcohol or drug use. He experiences high levels of stress and anxiety, which he describes as his 'normal', but denies any history of panic attacks.  He is currently taking Protonix  for acid reflux, although he does not report significant symptoms of reflux. He also notes occasional epistaxis and a lump in his armpit.   Outpatient Encounter Medications as of 06/21/2024  Medication Sig   pantoprazole  (PROTONIX ) 20 MG tablet Take 2 tablets (40 mg total) by mouth daily.   No facility-administered encounter medications on file as of 06/21/2024.    Past Medical History:  Diagnosis Date   ACL (anterior cruciate ligament) tear    RIGHT KNEE   Medical history non-contributory    Right knee meniscal tear     Past Surgical History:  Procedure Laterality Date   ANTERIOR CRUCIATE LIGAMENT REPAIR Right 07/16/2016  Procedure: RIGHT KNEE REVISION, RECONSTRUCTION ANTERIOR CRUCIATE LIGAMENT (ACL) WITH HAMSTRING GRAFT, BONE PATELLA BONE AUTOGRAFT, REMOVAL OF HARDWARE TIMES TWO;  Surgeon: Glendia Cordella Hutchinson, MD;  Location: [facility] OR;  Service: Orthopedics;  Laterality: Right;   KNEE ARTHROSCOPY WITH ANTERIOR  CRUCIATE LIGAMENT (ACL) REPAIR Right 10-04-2012   KNEE ARTHROSCOPY WITH ANTERIOR CRUCIATE LIGAMENT (ACL) REPAIR Right 05/16/2013   Procedure: RIGHT KNEE ARTHROSCOPY DEBRIDEMENT PARTIAL MEDIAL  MENISCECTOMY AND ANTERIOR CRUCIATE LIGAMENT (ACL) RECONSTRUCTION ALLOGRAFT REVISION ;  Surgeon: Lamar Collet, MD;  Location: [facility] [place];  Service: Orthopedics;  Laterality: Right;    Family History  Problem Relation Age of Onset   Hearing loss Mother    Drug abuse Mother     Social History   Socioeconomic History   Marital status: Single    Spouse name: Not on file   Number of children: 1   Years of education: Not on file   Highest education level: Associate degree: academic program  Occupational History   Not on file  Tobacco Use   Smoking status: Former    Current packs/day: 0.00    Types: Cigarettes    Quit date: 06/01/2016    Years since quitting: 8.0    Passive exposure: Past   Smokeless tobacco: Never   Tobacco comments:    06/20/2024 Patient vape daily  Vaping Use   Vaping status: Every Day   Substances: Nicotine, Flavoring  Substance and Sexual Activity   Alcohol use: Yes    Comment: social   Drug use: No   Sexual activity: Yes    Comment: partner is on pill  Other Topics Concern   Not on file  Social History Narrative   Not on file   Social Drivers of Health   Tobacco Use: Medium Risk (06/21/2024)   Patient History    Smoking Tobacco Use: Former    Smokeless Tobacco Use: Never    Passive Exposure: Past  Physicist, Medical Strain: Not on [employer] Insecurity: Not on file  Transportation Needs: Not on file  Physical Activity: Not on file  Stress: Not on file  Social Connections: Unknown (10/13/2021)   Received from [facility]   Social Network    Social Network: Not on file  Intimate Partner Violence: Unknown (09/04/2021)   Received from [facility]   HITS    Physically Hurt: Not on file    Insult or Talk Down To:  Not on file    Threaten Physical Harm: Not on file    Scream or Curse: Not on file  Depression (PHQ2-9): Medium Risk (06/21/2024)   Depression (PHQ2-9)    PHQ-2 Score: 8  Alcohol Screen: Not on file  Housing: Not on file  Utilities: Not on file  Health Literacy: Not on file    Review of Systems  Constitutional: Negative.   HENT: Negative.    Eyes: Negative.   Respiratory: Negative.    Cardiovascular: Negative.   Gastrointestinal: Negative.   Genitourinary: Negative.   Musculoskeletal: Negative.   Skin: Negative.   Neurological:  Positive for sensory change and headaches.  Endo/Heme/Allergies: Negative.   Psychiatric/Behavioral:  The patient is nervous/anxious.   All other systems reviewed and are negative.     Objective:    BP (!) 140/84   Pulse 85   Temp 98.3 F (36.8 C)   Ht 5' 10 (1.778 m)   Wt 184 lb 3.2 oz (83.6 kg)   SpO2 96%   BMI 26.43 kg/m   Physical Exam Vitals  and nursing note reviewed.  Constitutional:      Appearance: Normal appearance. He is normal weight.  HENT:     Head: Normocephalic and atraumatic.     Right Ear: Tympanic membrane, ear canal and external ear normal.     Left Ear: Tympanic membrane, ear canal and external ear normal.     Nose: Nose normal.     Mouth/Throat:     Mouth: Mucous membranes are moist.     Pharynx: Oropharynx is clear.  Eyes:     Extraocular Movements: Extraocular movements intact.     Right eye: Normal extraocular motion and no nystagmus.     Left eye: Normal extraocular motion and no nystagmus.     Conjunctiva/sclera: Conjunctivae normal.     Pupils: Pupils are equal, round, and reactive to light.  Cardiovascular:     Rate and Rhythm: Normal rate and regular rhythm.     Pulses: Normal pulses.     Heart sounds: Normal heart sounds.  Pulmonary:     Effort: Pulmonary effort is normal.     Breath sounds: Normal breath sounds.  Abdominal:     General: Bowel sounds are normal.     Palpations: Abdomen is  soft.  Genitourinary:    Comments: Deferred using shared decision making Musculoskeletal:        General: Normal range of motion.     Cervical back: Normal range of motion and neck supple.  Skin:    General: Skin is warm and dry.     Capillary Refill: Capillary refill takes less than 2 seconds.  Neurological:     General: No focal deficit present.     Mental Status: He is alert. Mental status is at baseline.  Psychiatric:        Mood and Affect: Mood normal.        Speech: Speech normal.        Behavior: Behavior normal.        Thought Content: Thought content normal.        Cognition and Memory: Cognition and memory normal.        Judgment: Judgment normal.      Last CBC Lab Results  Component Value Date   WBC 5.5 06/19/2024   HGB 15.9 06/19/2024   HCT 46.3 06/19/2024   MCV 85.1 06/19/2024   MCH 29.2 06/19/2024   RDW 13.0 06/19/2024   PLT 225 06/19/2024   Last metabolic panel Lab Results  Component Value Date   GLUCOSE 154 (H) 06/19/2024   NA 138 06/19/2024   K 3.7 06/19/2024   CL 100 06/19/2024   CO2 25 06/19/2024   BUN 11 06/19/2024   CREATININE 0.88 06/19/2024   GFRNONAA >60 06/19/2024   CALCIUM 10.4 (H) 06/19/2024   ANIONGAP 13 06/19/2024   Last lipids Lab Results  Component Value Date   CHOL 187 06/20/2024   HDL 46 06/20/2024   LDLCALC 118 (H) 06/20/2024   TRIG 130 06/20/2024   CHOLHDL 4.1 06/20/2024   Last hemoglobin A1c No results found for: HGBA1C      Assessment & Plan:   Problem List Items Addressed This Visit       Other   Atypical chest pain   Numbness and tingling in left arm   Relevant Orders   Ambulatory referral to Neurology   Magnesium   Vision changes   Physical exam, annual - Primary   Relevant Orders   Comprehensive metabolic panel with GFR   Elevated blood pressure reading  in office without diagnosis of hypertension   Other Visit Diagnoses       Screening for thyroid disorder       Relevant Orders   TSH      Encounter for vitamin deficiency screening       Relevant Orders   Vitamin B12   VITAMIN D  25 Hydroxy (Vit-D Deficiency, Fractures)     Screening for diabetes mellitus       Relevant Orders   Hemoglobin A1c       Assessment and Plan Assessment & Plan Evaluation of recurrent neurological and chest symptoms Intermittent chest tightness, left arm numbness, and tingling. Normal cardiac function. Differential includes nerve compression, anxiety, or migraine. High calcium levels noted. - Referred to neurology for further evaluation and possible nerve conduction studies. - Ordered recheck of calcium levels. - Ordered vitamin D , vitamin B12, and thyroid level tests. - Ordered diabetes screening.  Hyperlipidemia Slightly elevated cholesterol levels. - Advised on dietary modifications to reduce red meat intake and improve overall diet.  Elevated blood pressure reading Advised to monitor at home and report to office if sustains greater than 130/80. Is followed by Cardiology currently with appointment scheduled next week. - Seek medical care for chest pain, palpitations, shortness of breath with exertion, dizziness/lightheadedness, vision changes, recurrent headaches, or swelling of extremities.  General health maintenance - Today your medical history was reviewed and routine physical exam with labs was performed. Recommend 150 minutes of moderate intensity exercise weekly and consuming a well-balanced diet. Advised to stop smoking if a smoker, avoid smoking if a non-smoker, limit alcohol consumption to 1 drink per day for women and 2 drinks per day for men, and avoid illicit drug use. Counseled on safe sex practices and offered STI testing today. Counseled in mental health awareness and when to seek medical care. Vaccine maintenance discussed. Appropriate health maintenance items reviewed. Return to office in 1 year for annual physical exam.      Return in about 1 year (around 06/21/2025) for  annual physical with labs 1 week prior.   Jeoffrey GORMAN Barrio, FNP [facility]     "

## 2024-06-22 ENCOUNTER — Ambulatory Visit: Payer: Self-pay | Admitting: Family Medicine

## 2024-06-22 DIAGNOSIS — R7401 Elevation of levels of liver transaminase levels: Secondary | ICD-10-CM

## 2024-06-22 DIAGNOSIS — R7989 Other specified abnormal findings of blood chemistry: Secondary | ICD-10-CM

## 2024-06-22 LAB — HEPATITIS PANEL, ACUTE
Hep A IgM: NONREACTIVE
Hep B C IgM: NONREACTIVE
Hepatitis B Surface Ag: NONREACTIVE
Hepatitis C Ab: NONREACTIVE

## 2024-06-22 LAB — VITAMIN B12: Vitamin B-12: 569 pg/mL (ref 200–1100)

## 2024-06-22 LAB — COMPREHENSIVE METABOLIC PANEL WITH GFR
AG Ratio: 2.1 (calc) (ref 1.0–2.5)
ALT: 218 U/L — ABNORMAL HIGH (ref 9–46)
AST: 91 U/L — ABNORMAL HIGH (ref 10–40)
Albumin: 5.1 g/dL (ref 3.6–5.1)
Alkaline phosphatase (APISO): 66 U/L (ref 36–130)
BUN: 11 mg/dL (ref 7–25)
CO2: 30 mmol/L (ref 20–32)
Calcium: 9.7 mg/dL (ref 8.6–10.3)
Chloride: 98 mmol/L (ref 98–110)
Creat: 0.89 mg/dL (ref 0.60–1.26)
Globulin: 2.4 g/dL (ref 1.9–3.7)
Glucose, Bld: 81 mg/dL (ref 65–99)
Potassium: 4.1 mmol/L (ref 3.5–5.3)
Sodium: 138 mmol/L (ref 135–146)
Total Bilirubin: 0.5 mg/dL (ref 0.2–1.2)
Total Protein: 7.5 g/dL (ref 6.1–8.1)
eGFR: 117 mL/min/1.73m2

## 2024-06-22 LAB — TEST AUTHORIZATION

## 2024-06-22 LAB — HEMOGLOBIN A1C
Hgb A1c MFr Bld: 5.5 %
Mean Plasma Glucose: 111 mg/dL
eAG (mmol/L): 6.2 mmol/L

## 2024-06-22 LAB — IRON,TIBC AND FERRITIN PANEL
%SAT: 27 % (ref 20–48)
Ferritin: 283 ng/mL (ref 38–380)
Iron: 103 ug/dL (ref 50–180)
TIBC: 388 ug/dL (ref 250–425)

## 2024-06-22 LAB — VITAMIN D 25 HYDROXY (VIT D DEFICIENCY, FRACTURES): Vit D, 25-Hydroxy: 28 ng/mL — ABNORMAL LOW (ref 30–100)

## 2024-06-22 LAB — TSH: TSH: 1.28 m[IU]/L (ref 0.40–4.50)

## 2024-06-23 LAB — MAGNESIUM: Magnesium: 2.1 mg/dL (ref 1.5–2.5)

## 2024-06-28 ENCOUNTER — Ambulatory Visit: Payer: Self-pay

## 2024-06-28 ENCOUNTER — Encounter: Payer: Self-pay | Admitting: Diagnostic Neuroimaging

## 2024-06-28 ENCOUNTER — Telehealth: Payer: Self-pay | Admitting: Diagnostic Neuroimaging

## 2024-06-28 ENCOUNTER — Ambulatory Visit: Admitting: Diagnostic Neuroimaging

## 2024-06-28 VITALS — BP 120/82 | HR 65 | Ht 70.0 in | Wt 183.6 lb

## 2024-06-28 DIAGNOSIS — R0609 Other forms of dyspnea: Secondary | ICD-10-CM

## 2024-06-28 DIAGNOSIS — R071 Chest pain on breathing: Secondary | ICD-10-CM

## 2024-06-28 DIAGNOSIS — R0602 Shortness of breath: Secondary | ICD-10-CM | POA: Diagnosis not present

## 2024-06-28 DIAGNOSIS — R200 Anesthesia of skin: Secondary | ICD-10-CM | POA: Diagnosis not present

## 2024-06-28 DIAGNOSIS — R202 Paresthesia of skin: Secondary | ICD-10-CM

## 2024-06-28 NOTE — Progress Notes (Signed)
 "  [facility]  PATIENT: [name] DOB: [birth year]  REFERRING CLINICIAN: Kayla Jeoffrey RAMAN, FNP HISTORY FROM: patient  REASON FOR VISIT: new consult   HISTORICAL  CHIEF COMPLAINT:  Chief Complaint  Patient presents with   RM 7     Patient is here alone for numbness and tingling in left arm - he also has issue with breathing for the last 2 weeks and pain of the left side of his chest. No signs of heart attack and has scan from cardiology and is released for ER when told to be check out for possible heart attack. Lung look clear as well    Results    Has had labs done at ER, Cardiology, and PCP all providers are through [facility]     HISTORY OF PRESENT ILLNESS:   34 year old male here for evaluation of abnormal sensations.  2 weeks ago patient was taking Christmas lights off of his home, when he got off the ladder and felt a tightness in his left chest.  No significant pain.  Some sensations radiated down his left arm and towards his left scapula.  No tenderness to palpation.  No restriction in range of motion.  Contacted PCP and was advised to go to the emergency room to rule out cardiac etiologies.  Patient went to ER on 06/13/2024 for evaluation.  Testing was reassuring and he was discharged home.  He returned to the ER on 06/19/2024 due to persistent and worsening symptoms.  Again testing was reassuring.  Since that time he is also followed up with cardiology and PCP.  No specific cause of symptoms has been found.  Patient continues to have a tightness sensation below his left pectoral muscle and into his left axillary and latissimus region.  He also has a numbness sensation that radiates down his left arm to his wrist.  Has some mild left leg numbness sensation and some sensations in his left face as well.  Has been having some intermittent issues of lightheadedness and dizziness.  He feels some dyspnea on exertion and shortness of breath.  History of migraine headaches in  the past.  These consisted of pain in around his eye and temporal region with sensitivity light and sound.  These have been stable.   REVIEW OF SYSTEMS: Full 14 system review of systems performed and negative with exception of: as per HPI.  ALLERGIES: Allergies[1]  HOME MEDICATIONS: Outpatient Medications Prior to Visit  Medication Sig Dispense Refill   pantoprazole  (PROTONIX ) 20 MG tablet Take 2 tablets (40 mg total) by mouth daily. (Patient not taking: Reported on 06/28/2024) 14 tablet 0   No facility-administered medications prior to visit.    PAST MEDICAL HISTORY: Past Medical History:  Diagnosis Date   ACL (anterior cruciate ligament) tear    RIGHT KNEE   Medical history non-contributory    Right knee meniscal tear     PAST SURGICAL HISTORY: Past Surgical History:  Procedure Laterality Date   ANTERIOR CRUCIATE LIGAMENT REPAIR Right 07/16/2016   Procedure: RIGHT KNEE REVISION, RECONSTRUCTION ANTERIOR CRUCIATE LIGAMENT (ACL) WITH HAMSTRING GRAFT, BONE PATELLA BONE AUTOGRAFT, REMOVAL OF HARDWARE TIMES TWO;  Surgeon: Glendia Cordella Hutchinson, MD;  Location: [facility] OR;  Service: Orthopedics;  Laterality: Right;   KNEE ARTHROSCOPY WITH ANTERIOR CRUCIATE LIGAMENT (ACL) REPAIR Right 10-04-2012   KNEE ARTHROSCOPY WITH ANTERIOR CRUCIATE LIGAMENT (ACL) REPAIR Right 05/16/2013   Procedure: RIGHT KNEE ARTHROSCOPY DEBRIDEMENT PARTIAL MEDIAL  MENISCECTOMY AND ANTERIOR CRUCIATE LIGAMENT (ACL) RECONSTRUCTION ALLOGRAFT REVISION ;  Surgeon:  Lamar Collet, MD;  Location: [facility];  Service: Orthopedics;  Laterality: Right;    FAMILY HISTORY: Family History  Problem Relation Age of Onset   Migraines Mother    Hearing loss Mother    Drug abuse Mother    Stroke Maternal Grandmother    Stroke Other    Seizures Neg Hx    Sleep apnea Neg Hx     SOCIAL HISTORY: Social History   Socioeconomic History   Marital status: Single    Spouse name: Not on file   Number of children: 1    Years of education: Not on file   Highest education level: Associate degree: academic program  Occupational History   Not on file  Tobacco Use   Smoking status: Some Days    Current packs/day: 0.00    Types: Cigarettes, E-cigarettes    Last attempt to quit: 06/01/2016    Years since quitting: 8.0    Passive exposure: Past   Smokeless tobacco: Never   Tobacco comments:    06/20/2024 Patient vape daily  Vaping Use   Vaping status: Every Day   Substances: Nicotine, Flavoring  Substance and Sexual Activity   Alcohol use: Yes    Comment: social   Drug use: No   Sexual activity: Yes    Comment: partner is on pill  Other Topics Concern   Not on file  Social History Narrative   Not on file   Social Drivers of Health   Tobacco Use: High Risk (06/28/2024)   Patient History    Smoking Tobacco Use: Some Days    Smokeless Tobacco Use: Never    Passive Exposure: Past  Financial Resource Strain: Not on file  Food Insecurity: Not on file  Transportation Needs: Not on file  Physical Activity: Not on file  Stress: Not on file  Social Connections: Unknown (10/13/2021)   Received from [facility]   Social Network    Social Network: Not on file  Intimate Partner Violence: Unknown (09/04/2021)   Received from [facility]   HITS    Physically Hurt: Not on file    Insult or Talk Down To: Not on file    Threaten Physical Harm: Not on file    Scream or Curse: Not on file  Depression (PHQ2-9): Medium Risk (06/21/2024)   Depression (PHQ2-9)    PHQ-2 Score: 8  Alcohol Screen: Not on file  Housing: Not on file  Utilities: Not on file  Health Literacy: Not on file     PHYSICAL EXAM  GENERAL EXAM/CONSTITUTIONAL: Vitals:  Vitals:   06/28/24 1529  BP: 120/82  Pulse: 65  SpO2: 98%  Weight: 183 lb 9.6 oz (83.3 kg)  Height: 5' 10 (1.778 m)   Body mass index is 26.34 kg/m. Wt Readings from Last 3 Encounters:  06/28/24 183 lb 9.6 oz (83.3 kg)  06/21/24 184 lb 3.2 oz (83.6 kg)   06/20/24 184 lb 3.2 oz (83.6 kg)   Patient is in no distress; well developed, nourished and groomed; neck is supple  CARDIOVASCULAR: Examination of carotid arteries is normal; no carotid bruits Regular rate and rhythm, no murmurs Examination of peripheral vascular system by observation and palpation is normal  EYES: Ophthalmoscopic exam of optic discs and posterior segments is normal; no papilledema or hemorrhages No results found.  MUSCULOSKELETAL: Gait, strength, tone, movements noted in Neurologic exam below  NEUROLOGIC: MENTAL STATUS:      No data to display  awake, alert, oriented to person, place and time recent and remote memory intact normal attention and concentration language fluent, comprehension intact, naming intact fund of knowledge appropriate  CRANIAL NERVE:  2nd - no papilledema on fundoscopic exam 2nd, 3rd, 4th, 6th - pupils equal and reactive to light, visual fields full to confrontation, extraocular muscles intact, no nystagmus 5th - facial sensation symmetric 7th - facial strength symmetric 8th - hearing intact 9th - palate elevates symmetrically, uvula midline 11th - shoulder shrug symmetric 12th - tongue protrusion midline  MOTOR:  normal bulk and tone, full strength in the BUE, BLE  SENSORY:  normal and symmetric to light touch, temperature, vibration  COORDINATION:  finger-nose-finger, fine finger movements normal  REFLEXES:  deep tendon reflexes present and symmetric  GAIT/STATION:  narrow based gait     DIAGNOSTIC DATA (LABS, IMAGING, TESTING) - I reviewed patient records, labs, notes, testing and imaging myself where available.  Lab Results  Component Value Date   WBC 5.5 06/19/2024   HGB 15.9 06/19/2024   HCT 46.3 06/19/2024   MCV 85.1 06/19/2024   PLT 225 06/19/2024      Component Value Date/Time   NA 138 06/21/2024 1524   K 4.1 06/21/2024 1524   CL 98 06/21/2024 1524   CO2 30 06/21/2024 1524   GLUCOSE  81 06/21/2024 1524   BUN 11 06/21/2024 1524   CREATININE 0.89 06/21/2024 1524   CALCIUM 9.7 06/21/2024 1524   PROT 7.5 06/21/2024 1524   AST 91 (H) 06/21/2024 1524   ALT 218 (H) 06/21/2024 1524   BILITOT 0.5 06/21/2024 1524   GFRNONAA >60 06/19/2024 1640   GFRAA >60 07/10/2016 1524   Lab Results  Component Value Date   CHOL 187 06/20/2024   HDL 46 06/20/2024   LDLCALC 118 (H) 06/20/2024   TRIG 130 06/20/2024   CHOLHDL 4.1 06/20/2024   Lab Results  Component Value Date   HGBA1C 5.5 06/21/2024   Lab Results  Component Value Date   VITAMINB12 569 06/21/2024   Lab Results  Component Value Date   TSH 1.28 06/21/2024       ASSESSMENT AND PLAN  34 y.o. year old male here with:   Dx:  1. Left arm numbness   2. Chest pain on breathing   3. SOB (shortness of breath)   4. DOE (dyspnea on exertion)   5. Left leg numbness   6. Numbness and tingling of left side of face     PLAN:  LEFT CHEST TIGHTNESS, DYSPNEA ON EXERTION, SHORTNESS OF BREATH (since early Jan 2026; prodromal flu-like illness) - check CTA chest (rule out PE; rule out aortic dissection)  LEFT ARM NUMBNESS (slightly in left face and left leg) - check MRI brain, cervical spine (rule out autoimmune, inflamm, vascular causes)  Orders Placed This Encounter  Procedures   MR BRAIN W WO CONTRAST   MR CERVICAL SPINE W WO CONTRAST   CT Angio Chest Pulmonary Embolism (PE) W or WO Contrast   Return for pending if symptoms worsen or fail to improve, pending test results.  I reviewed labs, notes, records myself. I summarized findings and reviewed with patient, for this condition (left face, arm, leg numbness) requiring high complexity decision making.    EDUARD FABIENE HANLON, MD 06/28/2024, 4:20 PM Certified in Neurology, Neurophysiology and Neuroimaging  [facility] [address], [address] [place], [place] [address] [phone]     [1] No Known Allergies  "

## 2024-06-28 NOTE — Telephone Encounter (Signed)
 CHARON barrows: [phone] exp. 06/28/24-07/27/24 sent to [facility] [phone]

## 2024-06-28 NOTE — Patient Instructions (Signed)
" °  LEFT CHEST TIGHTNESS, DYSPNEA ON EXERTION, SHORTNESS OF BREATH - check CTA chest  LEFT ARM NUMBNESS (slightly in left face and left leg) - check MRI brain, cervical spine "

## 2024-06-28 NOTE — Telephone Encounter (Signed)
 FYI Only or Action Required?: Action required by provider: clinical question for provider and update on patient condition.  Patient was last seen in primary care on 06/21/2024 by Kayla Jeoffrey RAMAN, FNP.  Called Nurse Triage reporting Shortness of Breath.  Symptoms began 06/13/24.  Interventions attempted: Other: ER X 2, cardiology.  Symptoms are: unchanged.  Triage Disposition: See HCP Within 4 Hours (Or PCP Triage)  Patient/caregiver understands and will follow disposition?: No, wishes to speak with PCP   Reason for Disposition  [1] MILD difficulty breathing (e.g., minimal/no SOB at rest, SOB with walking, pulse < 100) AND [2] NEW-onset or WORSE than normal  Answer Assessment - Initial Assessment Questions Patient called in for continued shortness of breath, chest heaviness that began around 06/13/24. He has been evaluated in the ED on 06/13/24 and 06/19/24 with reassuring exam, labs, EKG, xray reported normal by patient. He had a cardiology appointment on 06/20/24 with plan for cardiac scoring on 06/30/24. He was evaluated by pcp in office on 06/21/24 and referred to neurology. While awaiting call back from nurse triage patient received call from neurology and scheduled new patient appointment today. Patient is asking if he needs another appointment with PCP. He is concerned as to why he is still experiencing these symptoms. He described his breathing as harder to pull in a breath, airway feels tight-not on inhalers. He is question if anxiety is playing a role in his symptoms.    1. RESPIRATORY STATUS: Describe your breathing? (e.g., wheezing, shortness of breath, unable to speak, severe coughing)      Shortness of breath  2. ONSET: When did this breathing problem begin?      06/13/24 3. PATTERN Does the difficult breathing come and go, or has it been constant since it started?      intermittent 4. SEVERITY: How bad is your breathing? (e.g., mild, moderate, severe)      mild 5.  RECURRENT SYMPTOM: Have you had difficulty breathing before? If Yes, ask: When was the last time? and What happened that time?      yes 6. CARDIAC HISTORY: Do you have any history of heart disease? (e.g., heart attack, angina, bypass surgery, angioplasty)      no 7. LUNG HISTORY: Do you have any history of lung disease?  (e.g., pulmonary embolus, asthma, emphysema)     no 8. CAUSE: What do you think is causing the breathing problem?      Unsure-possible anxiety if no medical cause 9. OTHER SYMPTOMS: Do you have any other symptoms? (e.g., chest pain, cough, dizziness, fever, runny nose)      Lung/chest tightness, brain fog intermittently.  Protocols used: Breathing Difficulty-A-AH Message from [facility] sent at 06/28/2024 10:29 AM EST  Reason for Triage: Patient c/o SOB, chest pain, and dizziness, went to hospital 2x this week and was cleared and  saw cardio last week who said he was good, he wanted to speak to provider due to no one being able to explain his symps, currently has SOB but at work right now.

## 2024-06-29 ENCOUNTER — Ambulatory Visit: Admitting: Family Medicine

## 2024-06-29 ENCOUNTER — Encounter: Payer: Self-pay | Admitting: Family Medicine

## 2024-06-29 VITALS — BP 120/70 | HR 78 | Temp 98.0°F | Ht 70.0 in | Wt 180.0 lb

## 2024-06-29 DIAGNOSIS — F419 Anxiety disorder, unspecified: Secondary | ICD-10-CM | POA: Diagnosis not present

## 2024-06-29 DIAGNOSIS — R0602 Shortness of breath: Secondary | ICD-10-CM | POA: Diagnosis not present

## 2024-06-29 MED ORDER — HYDROXYZINE HCL 25 MG PO TABS: 25.0000 mg | ORAL_TABLET | Freq: Three times a day (TID) | ORAL | 0 refills | Status: AC | PRN

## 2024-06-29 NOTE — Progress Notes (Signed)
 "  Acute Office Visit  Patient ID: [name], male    DOB: 19-May-1991, 34 y.o.   MRN: [id number]  PCP: Kayla David RAMAN, FNP  Chief Complaint  Patient presents with   Medical Management of Chronic Issues    Onset SOB, lightheadedness, overheating, brain fog      Subjective:     HPI  Discussed the use of AI scribe software for clinical note transcription with the patient, who gave verbal consent to proceed.  History of Present Illness [name] is a 33 year old male who presents with ongoing shortness of breath and chest tightness.  He began experiencing shortness of breath and chest tightness yesterday when he was sitting. The chest felt 'super tight' with a sensation of fluttering, located in the chest, back, and under the arm, but without numbness. This is similar to the episodes he has been experiencing for which he has had extensive workup in the ED. He has also seen cardiology and neurology for these unexplained symptoms. The shortness of breath felt labored, requiring effort to breathe normally. He has not experienced this today and has felt the best he has felt in a while.  He has a history of chest pain and numbness, for which he has been extensively worked up, including a referral to neurology. Imaging of the neck and brain is scheduled following a recent neurology evaluation. He also has a cardiac scoring test and a chest CT scheduled for tomorrow.  No wheezing or high-pitched breathing. Today has been symptom-free. He recalls a severe flu around Christmas, two to three weeks before his symptoms began, which has since resolved. He also reports frequent mucus production and a recent epistaxis.  He denies excessive caffeine, alcohol, or nicotine use, though he had coffee yesterday morning. He is concerned about his lung and heart health, especially as he prepares to return to the [employer], and is worried about his health impacting his ability to perform. He has not been  prescribed any medications for anxiety but is considering trying a low-tier medication to manage stress, as his fiance often tells him he is stressed.   Review of Systems  All other systems reviewed and are negative.   Past Medical History:  Diagnosis Date   ACL (anterior cruciate ligament) tear    RIGHT KNEE   Medical history non-contributory    Right knee meniscal tear     Past Surgical History:  Procedure Laterality Date   ANTERIOR CRUCIATE LIGAMENT REPAIR Right 07/16/2016   Procedure: RIGHT KNEE REVISION, RECONSTRUCTION ANTERIOR CRUCIATE LIGAMENT (ACL) WITH HAMSTRING GRAFT, BONE PATELLA BONE AUTOGRAFT, REMOVAL OF HARDWARE TIMES TWO;  Surgeon: Glendia Cordella Hutchinson, MD;  Location: [facility] OR;  Service: Orthopedics;  Laterality: Right;   KNEE ARTHROSCOPY WITH ANTERIOR CRUCIATE LIGAMENT (ACL) REPAIR Right 10-04-2012   KNEE ARTHROSCOPY WITH ANTERIOR CRUCIATE LIGAMENT (ACL) REPAIR Right 05/16/2013   Procedure: RIGHT KNEE ARTHROSCOPY DEBRIDEMENT PARTIAL MEDIAL  MENISCECTOMY AND ANTERIOR CRUCIATE LIGAMENT (ACL) RECONSTRUCTION ALLOGRAFT REVISION ;  Surgeon: Lamar Collet, MD;  Location: [facility];  Service: Orthopedics;  Laterality: Right;    Outpatient Medications Prior to Visit  Medication Sig Dispense Refill   pantoprazole  (PROTONIX ) 20 MG tablet Take 2 tablets (40 mg total) by mouth daily. (Patient not taking: Reported on 06/28/2024) 14 tablet 0   No facility-administered medications prior to visit.    Allergies[1]     Objective:    BP 120/70   Pulse 78   Temp 98 F (36.7 C)  Ht 5' 10 (1.778 m)   Wt 180 lb (81.6 kg)   SpO2 98%   BMI 25.83 kg/m    Physical Exam Vitals and nursing note reviewed.  Constitutional:      Appearance: Normal appearance. He is normal weight.  HENT:     Head: Normocephalic and atraumatic.  Cardiovascular:     Rate and Rhythm: Normal rate and regular rhythm.     Pulses: Normal pulses.     Heart sounds: Normal heart sounds.   Pulmonary:     Effort: Pulmonary effort is normal.     Breath sounds: Normal breath sounds.  Skin:    General: Skin is warm and dry.     Capillary Refill: Capillary refill takes less than 2 seconds.  Neurological:     General: No focal deficit present.     Mental Status: He is alert and oriented to person, place, and time. Mental status is at baseline.  Psychiatric:        Mood and Affect: Mood normal.        Behavior: Behavior normal.        Thought Content: Thought content normal.        Judgment: Judgment normal.       No results found for any visits on 06/29/24.     Assessment & Plan:   Problem List Items Addressed This Visit   None Visit Diagnoses       Anxiety    -  Primary   Relevant Medications   hydrOXYzine  (ATARAX ) 25 MG tablet     Shortness of breath           Assessment and Plan Assessment & Plan Shortness of breath Intermittent symptoms with differential diagnosis including pulmonary embolism, arrhythmia, and anxiety. Normal chest x-ray and cardiology workup thus far. CT cardiac scoring and CT angiography scheduled. - Proceed with CT cardiac scoring and CT angiography. - Advised to avoid caffeine, alcohol, and nicotine. - Discuss symptoms with cardiology and consider long term cardiac monitor.  Anxiety disorder Chronic stress and anxiety potentially contributing to symptoms. Hydroxyzine  chosen for acute management due to lower risk of dependency. - Prescribed hydroxyzine  25mg  TID PRN for acute anxiety. - Advised use in a setting where sedation is not a concern. - Discussed potential long-term management with SSRIs.    Meds ordered this encounter  Medications   hydrOXYzine  (ATARAX ) 25 MG tablet    Sig: Take 1 tablet (25 mg total) by mouth every 8 (eight) hours as needed for anxiety.    Dispense:  30 tablet    Refill:  0    Supervising Provider:   DUANNE LOWERS T [[id number]]    Return as scheduled.  [name] Barrio, FNP [facility]  [facility]      [1] No Known Allergies  "

## 2024-06-30 ENCOUNTER — Ambulatory Visit: Payer: Self-pay | Admitting: Diagnostic Neuroimaging

## 2024-06-30 ENCOUNTER — Ambulatory Visit (HOSPITAL_COMMUNITY)
Admission: RE | Admit: 2024-06-30 | Discharge: 2024-06-30 | Disposition: A | Source: Ambulatory Visit | Attending: Cardiovascular Disease | Admitting: Cardiovascular Disease

## 2024-06-30 ENCOUNTER — Ambulatory Visit (HOSPITAL_COMMUNITY)
Admission: RE | Admit: 2024-06-30 | Discharge: 2024-06-30 | Disposition: A | Payer: Self-pay | Source: Ambulatory Visit | Attending: Cardiovascular Disease | Admitting: Cardiovascular Disease

## 2024-06-30 ENCOUNTER — Ambulatory Visit (HOSPITAL_COMMUNITY)

## 2024-06-30 DIAGNOSIS — R0602 Shortness of breath: Secondary | ICD-10-CM | POA: Insufficient documentation

## 2024-06-30 DIAGNOSIS — R0609 Other forms of dyspnea: Secondary | ICD-10-CM | POA: Diagnosis present

## 2024-06-30 DIAGNOSIS — R071 Chest pain on breathing: Secondary | ICD-10-CM | POA: Diagnosis present

## 2024-06-30 DIAGNOSIS — R0789 Other chest pain: Secondary | ICD-10-CM | POA: Insufficient documentation

## 2024-06-30 MED ORDER — IOHEXOL 350 MG/ML SOLN
75.0000 mL | Freq: Once | INTRAVENOUS | Status: AC | PRN
  Administered 2024-06-30: 75 mL via INTRAVENOUS

## 2024-07-03 ENCOUNTER — Ambulatory Visit (HOSPITAL_BASED_OUTPATIENT_CLINIC_OR_DEPARTMENT_OTHER): Admitting: Cardiology

## 2024-07-05 ENCOUNTER — Other Ambulatory Visit

## 2024-07-05 DIAGNOSIS — R200 Anesthesia of skin: Secondary | ICD-10-CM

## 2024-07-05 MED ORDER — GADOBENATE DIMEGLUMINE 529 MG/ML IV SOLN
15.0000 mL | Freq: Once | INTRAVENOUS | Status: AC | PRN
  Administered 2024-07-05: 15 mL via INTRAVENOUS

## 2024-07-06 ENCOUNTER — Ambulatory Visit
Admission: RE | Admit: 2024-07-06 | Discharge: 2024-07-06 | Disposition: A | Source: Ambulatory Visit | Attending: Family Medicine

## 2024-07-06 DIAGNOSIS — R7401 Elevation of levels of liver transaminase levels: Secondary | ICD-10-CM

## 2024-07-12 ENCOUNTER — Ambulatory Visit (HOSPITAL_COMMUNITY): Admission: RE | Admit: 2024-07-12 | Source: Ambulatory Visit

## 2024-10-05 ENCOUNTER — Ambulatory Visit: Admitting: Family Medicine

## 2025-06-21 ENCOUNTER — Other Ambulatory Visit

## 2025-06-25 ENCOUNTER — Encounter: Admitting: Family Medicine
